# Patient Record
Sex: Female | Born: 1985
Health system: Southern US, Community
[De-identification: ages and names within clinical notes are randomized; demographics above are authoritative.]

## PROBLEM LIST (undated history)

## (undated) DIAGNOSIS — R519 Headache, unspecified: Secondary | ICD-10-CM

## (undated) DIAGNOSIS — J40 Bronchitis, not specified as acute or chronic: Secondary | ICD-10-CM

## (undated) DIAGNOSIS — Z8489 Family history of other specified conditions: Secondary | ICD-10-CM

## (undated) DIAGNOSIS — J189 Pneumonia, unspecified organism: Secondary | ICD-10-CM

## (undated) DIAGNOSIS — H6690 Otitis media, unspecified, unspecified ear: Secondary | ICD-10-CM

## (undated) DIAGNOSIS — I493 Ventricular premature depolarization: Secondary | ICD-10-CM

## (undated) DIAGNOSIS — R51 Headache: Secondary | ICD-10-CM

## (undated) DIAGNOSIS — F419 Anxiety disorder, unspecified: Secondary | ICD-10-CM

## (undated) HISTORY — PX: TONSILLECTOMY: SUR1361

## (undated) HISTORY — PX: OTHER SURGICAL HISTORY: SHX169

---
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
## 2007-09-19 ENCOUNTER — Emergency Department (HOSPITAL_COMMUNITY): Admission: EM | Admit: 2007-09-19 | Discharge: 2007-09-19 | Payer: Self-pay | Admitting: Emergency Medicine

## 2008-05-06 ENCOUNTER — Inpatient Hospital Stay: Payer: Self-pay | Admitting: Unknown Physician Specialty

## 2008-05-09 ENCOUNTER — Emergency Department: Payer: Self-pay | Admitting: Emergency Medicine

## 2011-04-10 LAB — DIFFERENTIAL
Basophils Absolute: 0
Eosinophils Absolute: 0
Eosinophils Relative: 0

## 2011-04-10 LAB — CBC
HCT: 37.8
MCHC: 34.9
MCV: 89.7
Platelets: 263
RDW: 13.8

## 2011-04-10 LAB — URINALYSIS, ROUTINE W REFLEX MICROSCOPIC
Bilirubin Urine: NEGATIVE
Protein, ur: NEGATIVE
Urobilinogen, UA: 0.2

## 2011-04-10 LAB — BASIC METABOLIC PANEL
BUN: 7
Chloride: 103
Glucose, Bld: 83
Potassium: 3.5

## 2012-08-02 ENCOUNTER — Emergency Department: Payer: Self-pay | Admitting: Emergency Medicine

## 2013-02-15 ENCOUNTER — Emergency Department: Payer: Self-pay | Admitting: Emergency Medicine

## 2013-05-02 ENCOUNTER — Emergency Department: Payer: Self-pay | Admitting: Emergency Medicine

## 2013-07-14 ENCOUNTER — Emergency Department: Payer: Self-pay | Admitting: Emergency Medicine

## 2013-07-14 LAB — URINALYSIS, COMPLETE
Blood: NEGATIVE
Glucose,UR: NEGATIVE mg/dL (ref 0–75)
Ketone: NEGATIVE
Nitrite: NEGATIVE
Ph: 5 (ref 4.5–8.0)
Protein: NEGATIVE
RBC,UR: <1 /HPF (ref 0–5)
Squamous Epithelial: 2
WBC UR: 2 /HPF (ref 0–5)

## 2013-07-14 LAB — COMPREHENSIVE METABOLIC PANEL
Bilirubin,Total: 0.2 mg/dL (ref 0.2–1.0)
Chloride: 105 mmol/L (ref 98–107)
Creatinine: 0.52 mg/dL — ABNORMAL LOW (ref 0.60–1.30)
EGFR (African American): >60
Glucose: 89 mg/dL (ref 65–99)
Potassium: 3.7 mmol/L (ref 3.5–5.1)
SGOT(AST): 22 U/L (ref 15–37)
SGPT (ALT): 26 U/L (ref 12–78)
Sodium: 134 mmol/L — ABNORMAL LOW (ref 136–145)
Total Protein: 7.7 g/dL (ref 6.4–8.2)

## 2013-07-14 LAB — CBC WITH DIFFERENTIAL/PLATELET
Eosinophil %: 1.2 %
HCT: 35.5 % (ref 35.0–47.0)
HGB: 12.4 g/dL (ref 12.0–16.0)
Lymphocyte #: 2.1 10*3/uL (ref 1.0–3.6)
MCH: 31 pg (ref 26.0–34.0)
RBC: 3.98 10*6/uL (ref 3.80–5.20)
RDW: 13.1 % (ref 11.5–14.5)

## 2013-07-14 LAB — RAPID INFLUENZA A&B ANTIGENS

## 2013-07-14 LAB — HCG, QUANTITATIVE, PREGNANCY: Beta Hcg, Quant.: 68599 m[IU]/mL — ABNORMAL HIGH

## 2013-08-02 ENCOUNTER — Emergency Department: Payer: Self-pay | Admitting: Emergency Medicine

## 2013-08-02 LAB — CBC
HCT: 33.4 % — ABNORMAL LOW (ref 35.0–47.0)
HGB: 11.9 g/dL — AB (ref 12.0–16.0)
MCH: 31.9 pg (ref 26.0–34.0)
MCHC: 35.6 g/dL (ref 32.0–36.0)
MCV: 90 fL (ref 80–100)
PLATELETS: 239 10*3/uL (ref 150–440)
RBC: 3.73 10*6/uL — ABNORMAL LOW (ref 3.80–5.20)
RDW: 13.2 % (ref 11.5–14.5)
WBC: 11.2 10*3/uL — ABNORMAL HIGH (ref 3.6–11.0)

## 2013-08-02 LAB — BASIC METABOLIC PANEL
Anion Gap: 5 — ABNORMAL LOW (ref 7–16)
BUN: 9 mg/dL (ref 7–18)
CALCIUM: 8.8 mg/dL (ref 8.5–10.1)
CHLORIDE: 105 mmol/L (ref 98–107)
Co2: 25 mmol/L (ref 21–32)
Creatinine: 0.6 mg/dL (ref 0.60–1.30)
EGFR (African American): >60
EGFR (Non-African Amer.): >60
Glucose: 84 mg/dL (ref 65–99)
Osmolality: 268 (ref 275–301)
Potassium: 3.1 mmol/L — ABNORMAL LOW (ref 3.5–5.1)
SODIUM: 135 mmol/L — AB (ref 136–145)

## 2013-12-21 ENCOUNTER — Observation Stay: Payer: Self-pay | Admitting: Obstetrics and Gynecology

## 2014-04-02 ENCOUNTER — Emergency Department: Payer: Self-pay | Admitting: Internal Medicine

## 2014-10-02 ENCOUNTER — Emergency Department: Payer: Self-pay | Admitting: Emergency Medicine

## 2014-10-03 ENCOUNTER — Emergency Department: Payer: Self-pay | Admitting: Physician Assistant

## 2015-01-17 ENCOUNTER — Encounter: Payer: Self-pay | Admitting: Emergency Medicine

## 2015-01-17 ENCOUNTER — Emergency Department
Admission: EM | Admit: 2015-01-17 | Discharge: 2015-01-17 | Disposition: A | Discharge status: Home / Self Care | Payer: BLUE CROSS/BLUE SHIELD | Attending: Emergency Medicine | Admitting: Emergency Medicine

## 2015-01-17 DIAGNOSIS — J020 Streptococcal pharyngitis: Secondary | ICD-10-CM | POA: Diagnosis not present

## 2015-01-17 DIAGNOSIS — J029 Acute pharyngitis, unspecified: Secondary | ICD-10-CM | POA: Diagnosis present

## 2015-01-17 DIAGNOSIS — Z720 Tobacco use: Secondary | ICD-10-CM | POA: Insufficient documentation

## 2015-01-17 HISTORY — DX: Otitis media, unspecified, unspecified ear: H66.90

## 2015-01-17 LAB — POCT RAPID STREP A: Streptococcus, Group A Screen (Direct): NEGATIVE

## 2015-01-17 MED ORDER — LIDOCAINE VISCOUS 2 % MT SOLN
15.0000 mL | Freq: Once | OROMUCOSAL | Status: AC
  Administered 2015-01-17: 15 mL via OROMUCOSAL

## 2015-01-17 MED ORDER — LIDOCAINE VISCOUS 2 % MT SOLN
OROMUCOSAL | Status: AC
  Filled 2015-01-17: qty 15

## 2015-01-17 MED ORDER — DEXAMETHASONE SODIUM PHOSPHATE 10 MG/ML IJ SOLN
INTRAMUSCULAR | Status: AC
  Administered 2015-01-17: 10 mg via INTRAMUSCULAR
  Filled 2015-01-17: qty 1

## 2015-01-17 MED ORDER — DEXAMETHASONE SODIUM PHOSPHATE 10 MG/ML IJ SOLN
10.0000 mg | Freq: Once | INTRAMUSCULAR | Status: AC
  Administered 2015-01-17: 10 mg via INTRAMUSCULAR

## 2015-01-17 MED ORDER — LIDOCAINE VISCOUS 2 % MT SOLN: 10.0000 mL | Freq: Two times a day (BID) | OROMUCOSAL | Status: DC | PRN

## 2015-01-17 NOTE — Discharge Instructions (Signed)
Continue home amoxicillin. Take over-the-counter ibuprofen or Tylenol as needed for pain or fever. Rest. Drink plenty of fluids. Take medication as prescribed.  Follow-up with her primary care physician in 2-3 days. Follow-up with the above as needed. Return to the ER for inability to eat or drink, persistent fever, new or worsening concerns.    Strep Throat Strep throat is an infection of the throat caused by a bacteria named Streptococcus pyogenes. Your health care provider may call the infection streptococcal "tonsillitis" or "pharyngitis" depending on whether there are signs of inflammation in the tonsils or back of the throat. Strep throat is most common in children aged 5-15 years during the cold months of the year, but it can occur in people of any age during any season. This infection is spread from person to person (contagious) through coughing, sneezing, or other close contact. SIGNS AND SYMPTOMS   Fever or chills.  Painful, swollen, red tonsils or throat.  Pain or difficulty when swallowing.  White or yellow spots on the tonsils or throat.  Swollen, tender lymph nodes or "glands" of the neck or under the jaw.  Red rash all over the body (rare). DIAGNOSIS  Many different infections can cause the same symptoms. A test must be done to confirm the diagnosis so the right treatment can be given. A "rapid strep test" can help your health care provider make the diagnosis in a few minutes. If this test is not available, a light swab of the infected area can be used for a throat culture test. If a throat culture test is done, results are usually available in a day or two. TREATMENT  Strep throat is treated with antibiotic medicine. HOME CARE INSTRUCTIONS   Gargle with 1 tsp of salt in 1 cup of warm water, 3-4 times per day or as needed for comfort.  Family members who also have a sore throat or fever should be tested for strep throat and treated with antibiotics if they have the strep  infection.  Make sure everyone in your household washes their hands well.  Do not share food, drinking cups, or personal items that could cause the infection to spread to others.  You may need to eat a soft food diet until your sore throat gets better.  Drink enough water and fluids to keep your urine clear or pale yellow. This will help prevent dehydration.  Get plenty of rest.  Stay home from school, day care, or work until you have been on antibiotics for 24 hours.  Take medicines only as directed by your health care provider.  Take your antibiotic medicine as directed by your health care provider. Finish it even if you start to feel better. SEEK MEDICAL CARE IF:   The glands in your neck continue to enlarge.  You develop a rash, cough, or earache.  You cough up green, yellow-brown, or bloody sputum.  You have pain or discomfort not controlled by medicines.  Your problems seem to be getting worse rather than better.  You have a fever. SEEK IMMEDIATE MEDICAL CARE IF:   You develop any new symptoms such as vomiting, severe headache, stiff or painful neck, chest pain, shortness of breath, or trouble swallowing.  You develop severe throat pain, drooling, or changes in your voice.  You develop swelling of the neck, or the skin on the neck becomes red and tender.  You develop signs of dehydration, such as fatigue, dry mouth, and decreased urination.  You become increasingly sleepy, or you  cannot wake up completely. MAKE SURE YOU:  Understand these instructions.  Will watch your condition.  Will get help right away if you are not doing well or get worse. Document Released: 06/30/2000 Document Revised: 11/17/2013 Document Reviewed: 09/01/2010 [facility] Patient Information 2015 [place], [place]. This information is not intended to replace advice given to you by your health care provider. Make sure you discuss any questions you have with your health care provider.

## 2015-01-17 NOTE — ED Notes (Signed)
Pt reports seen was seen Friday at work and was diagnosed with strep throat. Pt states she is currently Amoxicillin but states she feels no better.

## 2015-01-17 NOTE — ED Provider Notes (Signed)
[facility] Emergency Department Provider Note  ____________________________________________  Time seen: Approximately 12:10 PM  I have reviewed the triage vital signs and the nursing notes.   HISTORY  Chief Complaint Sore Throat   HPI Sarah Woods is a 29 y.o. female presents to the ER for complaints of sore throat. Patient states that she works at [employer], and had her sore throat evaluated Friday (2 days ago). Patient states onset of sore throat was Thursday. Patient states that at [facility] her strep swab was positive for strep throat. Patient states that she was then given a prescription for oral amoxicillin 875 mg tablets twice a day for 10 days. Patient states that she has taken this medication as prescribed, that states that she still has a sore throat.  Patient reports that it hurts to eat or drink, but continues to drink fluids well and eats some soup. Reports had a fever on Friday, none since. States last took ibuprofen last night. States did take amoxicillin dose this morning.  Describes sore throat pain as pain 7 out of 10 scratching and painful to swallow. Denies pain radiation. Denies neck or back pain. Denies fall or injury.   Past Medical History  Diagnosis Date  . Ear infection     There are no active problems to display for this patient.   Past Surgical History  Procedure Laterality Date  . Tubes in ears    . Cesarean section    tonsillectomy  No current outpatient prescriptions on file.  Allergies Review of patient's allergies indicates no known allergies.  No family history on file.  Social History History  Substance Use Topics  . Smoking status: Current Some Day Smoker  . Smokeless tobacco: Never Used  . Alcohol Use: No    Review of Systems Constitutional: No fever/chills Eyes: No visual changes. OEH:OZYYQMGN sore throat. Cardiovascular: Denies chest pain. Respiratory: Denies shortness  of breath. Gastrointestinal: No abdominal pain.  No nausea, no vomiting.  No diarrhea.  No constipation. Genitourinary: Negative for dysuria. Musculoskeletal: Negative for back pain. Skin: Negative for rash. Neurological: Negative for headaches, focal weakness or numbness.  10-point ROS otherwise negative.  ____________________________________________   PHYSICAL EXAM:  VITAL SIGNS: ED Triage Vitals  Enc Vitals Group     BP 01/17/15 1053 101/55 mmHg     Pulse Rate 01/17/15 1053 114     Resp 01/17/15 1053 20     Temp 01/17/15 1053 99.2 F (37.3 C)     Temp Source 01/17/15 1053 Oral     SpO2 01/17/15 1053 100 %     Weight 01/17/15 1053 170 lb (77.111 kg)     Height 01/17/15 1053 5\' 5"  (1.651 m)     Head Cir --      Peak Flow --      Pain Score 01/17/15 1056 10     Pain Loc --      Pain Edu? --      Excl. in [place]? --    Blood pressure 110/66, pulse 96, temperature 98.7 F (37.1 C), temperature source Oral, resp. rate 16, height 5\' 5"  (1.651 m), weight 170 lb (77.111 kg), last menstrual period 01/03/2015, SpO2 100 %.    Constitutional: Alert and oriented. Well appearing and in no acute distress. Eyes: Conjunctivae are normal. PERRL. EOMI. Head: Atraumatic. Nose: No congestion/rhinnorhea. Ears: no erythema, normal TMs.  Mouth/Throat: Mucous membranes are moist.  Pharynx mod erythema. No tonsillar swelling. No uvular shift or deviation.  Minimal bilateral phayngeal exudate. No drooling. Clearing oral secretions.  Neck: No stridor.  No cervical spine tenderness to palpation. Hematological/Lymphatic/Immunilogical: Mild anterior cervical lymphadenopathy. Cardiovascular: Normal rate, regular rhythm. Grossly normal heart sounds.  Good peripheral circulation. Respiratory: Normal respiratory effort.  No retractions. Lungs CTAB. Gastrointestinal: Soft and nontender. No distention. No abdominal bruits. No CVA tenderness. Musculoskeletal: No lower extremity tenderness nor edema.  No  joint effusions. Neurologic:  Normal speech and language. No gross focal neurologic deficits are appreciated. Speech is normal. No gait instability. Skin:  Skin is warm, dry and intact. No rash noted. Psychiatric: Mood and affect are normal. Speech and behavior are normal.  ____________________________________________   LABS (all labs ordered are listed, but only abnormal results are displayed)  Labs Reviewed  CULTURE, GROUP A STREP ([facility] ONLY)  POCT RAPID STREP A   ____________________________________________    INITIAL IMPRESSION / ASSESSMENT AND PLAN / ED COURSE  Pertinent labs & imaging results that were available during my care of the patient were reviewed by me and considered in my medical decision making (see chart for details).  No acute distress. Very well-appearing patient. Presents to the ER for complaints of 3 day history of sore throat. States strep throat swab positive on Friday at [facility] and states started on amoxicillin for same on Friday. States here today as she still has sore throat.  Well-appearing patient. Tolerating by mouth fluids. Moist mucous membranes. 10 mg IM Decadron 1 in ER. Viscous lidocaine in ER x one. Pt reports feeling better in ER post medications. Tolerating po fluids. Discussed continue antibiotics, prn viscous lidocaine.  Discuss strict follow-up and return parameters. Follow up with PCP in 2 days. Patient agreed to plan. ____________________________________________   FINAL CLINICAL IMPRESSION(S) / ED DIAGNOSES  Final diagnoses:  Strep throat      Marylene Land, NP 01/17/15 [place], NP 01/17/15 1635  Lavonia Drafts, MD 01/21/15 [phone]

## 2015-01-19 LAB — CULTURE, GROUP A STREP (THRC)

## 2015-05-27 ENCOUNTER — Emergency Department
Admission: EM | Admit: 2015-05-27 | Discharge: 2015-05-27 | Disposition: A | Discharge status: Home / Self Care | Payer: BLUE CROSS/BLUE SHIELD | Attending: Emergency Medicine | Admitting: Emergency Medicine

## 2015-05-27 DIAGNOSIS — K611 Rectal abscess: Secondary | ICD-10-CM | POA: Diagnosis not present

## 2015-05-27 DIAGNOSIS — F17200 Nicotine dependence, unspecified, uncomplicated: Secondary | ICD-10-CM | POA: Insufficient documentation

## 2015-05-27 DIAGNOSIS — K644 Residual hemorrhoidal skin tags: Secondary | ICD-10-CM | POA: Diagnosis not present

## 2015-05-27 DIAGNOSIS — K6289 Other specified diseases of anus and rectum: Secondary | ICD-10-CM | POA: Diagnosis present

## 2015-05-27 LAB — CBC WITH DIFFERENTIAL/PLATELET
Basophils Absolute: 0.1 10*3/uL (ref 0–0.1)
Basophils Relative: 1 %
Eosinophils Absolute: 0.2 10*3/uL (ref 0–0.7)
Eosinophils Relative: 2 %
HCT: 38.2 % (ref 35.0–47.0)
HEMOGLOBIN: 13 g/dL (ref 12.0–16.0)
LYMPHS ABS: 2.2 10*3/uL (ref 1.0–3.6)
LYMPHS PCT: 21 %
MCH: 30.7 pg (ref 26.0–34.0)
MCHC: 33.9 g/dL (ref 32.0–36.0)
MCV: 90.7 fL (ref 80.0–100.0)
Monocytes Absolute: 0.7 10*3/uL (ref 0.2–0.9)
Monocytes Relative: 7 %
NEUTROS PCT: 69 %
Neutro Abs: 7.5 10*3/uL — ABNORMAL HIGH (ref 1.4–6.5)
Platelets: 261 10*3/uL (ref 150–440)
RBC: 4.22 MIL/uL (ref 3.80–5.20)
RDW: 13.8 % (ref 11.5–14.5)
WBC: 10.7 10*3/uL (ref 3.6–11.0)

## 2015-05-27 LAB — COMPREHENSIVE METABOLIC PANEL
ALK PHOS: 60 U/L (ref 38–126)
ALT: 17 U/L (ref 14–54)
AST: 18 U/L (ref 15–41)
Albumin: 4.1 g/dL (ref 3.5–5.0)
Anion gap: 8 (ref 5–15)
BILIRUBIN TOTAL: 0.6 mg/dL (ref 0.3–1.2)
BUN: 14 mg/dL (ref 6–20)
CALCIUM: 9.4 mg/dL (ref 8.9–10.3)
CO2: 27 mmol/L (ref 22–32)
CREATININE: 0.75 mg/dL (ref 0.44–1.00)
Chloride: 104 mmol/L (ref 101–111)
Glucose, Bld: 99 mg/dL (ref 65–99)
Potassium: 3.3 mmol/L — ABNORMAL LOW (ref 3.5–5.1)
Sodium: 139 mmol/L (ref 135–145)
Total Protein: 8.2 g/dL — ABNORMAL HIGH (ref 6.5–8.1)

## 2015-05-27 MED ORDER — DEXTROSE 5 % IV SOLN
1.0000 g | Freq: Once | INTRAVENOUS | Status: AC
  Administered 2015-05-27: 1 g via INTRAVENOUS
  Filled 2015-05-27: qty 10

## 2015-05-27 MED ORDER — MORPHINE SULFATE (PF) 4 MG/ML IV SOLN
4.0000 mg | Freq: Once | INTRAVENOUS | Status: AC
  Administered 2015-05-27: 4 mg via INTRAVENOUS
  Filled 2015-05-27: qty 1

## 2015-05-27 MED ORDER — HYDROMORPHONE HCL 1 MG/ML IJ SOLN
1.0000 mg | Freq: Once | INTRAMUSCULAR | Status: AC
  Administered 2015-05-27: 1 mg via INTRAVENOUS
  Filled 2015-05-27: qty 1

## 2015-05-27 MED ORDER — OXYCODONE-ACETAMINOPHEN 5-325 MG PO TABS: 1.0000 | ORAL_TABLET | ORAL | Status: DC | PRN

## 2015-05-27 MED ORDER — SULFAMETHOXAZOLE-TRIMETHOPRIM 800-160 MG PO TABS: 2.0000 | ORAL_TABLET | Freq: Two times a day (BID) | ORAL | Status: DC

## 2015-05-27 MED ORDER — ONDANSETRON HCL 4 MG/2ML IJ SOLN
4.0000 mg | Freq: Once | INTRAMUSCULAR | Status: AC
  Administered 2015-05-27: 4 mg via INTRAVENOUS
  Filled 2015-05-27: qty 2

## 2015-05-27 NOTE — ED Provider Notes (Signed)
[facility] Emergency Department Provider Note ____________________________________________  Time seen: Approximately 9:50 PM  I have reviewed the triage vital signs and the nursing notes.   HISTORY  Chief Complaint Rectal Pain  HPI Sarah Woods is a 29 y.o. female who presents to the emergency department for evaluation of rectal pain. She has had hemorrhoids for quite some time that have been painful, but this pain is different and severe. She states she has been evaluated by PCP and advised to follow up with surgery, but has not scheduled an appointment. She states the pain is excruciating with movement or sitting. No relief with sitz baths or witch hazel wipes.   Past Medical History  Diagnosis Date  . Ear infection     There are no active problems to display for this patient.   Past Surgical History  Procedure Laterality Date  . Tubes in ears    . Cesarean section      Current Outpatient Rx  Name  Route  Sig  Dispense  Refill  . lidocaine (XYLOCAINE) 2 % solution   Mouth/Throat   Use as directed 10 mLs in the mouth or throat 2 (two) times daily as needed (sore throat,). Gargle then spit, use as needed for sore throat.   60 mL   0   . oxyCODONE-acetaminophen (PERCOCET/ROXICET) 5-325 MG tablet   Oral   Take 1-2 tablets by mouth every 4 (four) hours as needed for severe pain.   20 tablet   0   . sulfamethoxazole-trimethoprim (BACTRIM DS,SEPTRA DS) 800-160 MG tablet   Oral   Take 2 tablets by mouth 2 (two) times daily.   40 tablet   0     Allergies Review of patient's allergies indicates no known allergies.  No family history on file.  Social History Social History  Substance Use Topics  . Smoking status: Current Some Day Smoker  . Smokeless tobacco: Never Used  . Alcohol Use: No    Review of Systems Constitutional: No fever/chills Eyes: No visual changes. ENT: No sore throat. Cardiovascular: Denies chest  pain. Respiratory: Denies shortness of breath. Gastrointestinal: No abdominal pain.  No nausea, no vomiting.  No diarrhea.  No constipation. Positive for hemorrhoids and rectal pain. Genitourinary: Negative for dysuria. Musculoskeletal: Negative for back pain. Skin: Negative for rash. Neurological: Negative for headaches, focal weakness or numbness.  10-point ROS otherwise negative.  ____________________________________________   PHYSICAL EXAM:  VITAL SIGNS: ED Triage Vitals  Enc Vitals Group     BP 05/27/15 2117 120/72 mmHg     Pulse Rate 05/27/15 2117 99     Resp 05/27/15 2117 18     Temp 05/27/15 2117 98.2 F (36.8 C)     Temp Source 05/27/15 2117 Oral     SpO2 05/27/15 2117 100 %     Weight 05/27/15 2117 162 lb (73.483 kg)     Height 05/27/15 2117 5\' 5"  (1.651 m)     Head Cir --      Peak Flow --      Pain Score 05/27/15 2119 9     Pain Loc --      Pain Edu? --      Excl. in [place]? --     Constitutional: Alert and oriented. Well appearing and in no acute distress. Eyes: Conjunctivae are normal. PERRL. EOMI. Head: Atraumatic. Nose: No congestion/rhinnorhea. Mouth/Throat: Mucous membranes are moist.  Oropharynx non-erythematous. Neck: No stridor.  Cardiovascular: Normal rate, regular rhythm. Grossly normal heart sounds.  Good peripheral circulation. Respiratory: Normal respiratory effort.  No retractions. Lungs CTAB. Gastrointestinal: Soft and nontender. No distention. No abdominal bruits. No CVA tenderness. Rectal: External hemorrhoids noted that do not appear inflamed, thrombosed or infected. Palpable, erythematous approximately 2cm round mass noted at the 5 o'clock position at the edge of the anus. Mass is not raised above the surface of the skin, but does feel fluctuant. There is no induration. Musculoskeletal: No lower extremity tenderness nor edema.  No joint effusions. Neurologic:  Normal speech and language. No gross focal neurologic deficits are appreciated. No  gait instability. Skin:  Skin is warm, dry and intact. No rash noted. Psychiatric: Mood and affect are normal. Speech and behavior are normal.  ____________________________________________   LABS (all labs ordered are listed, but only abnormal results are displayed)  Labs Reviewed  CBC WITH DIFFERENTIAL/PLATELET - Abnormal; Notable for the following:    Neutro Abs 7.5 (*)    All other components within normal limits  COMPREHENSIVE METABOLIC PANEL - Abnormal; Notable for the following:    Potassium 3.3 (*)    Total Protein 8.2 (*)    All other components within normal limits   ____________________________________________  EKG  ____________________________________________  RADIOLOGY  Not indicated ____________________________________________   PROCEDURES  Procedure(s) performed: None  Critical Care performed: No  ____________________________________________   INITIAL IMPRESSION / ASSESSMENT AND PLAN / ED COURSE  Pertinent labs & imaging results that were available during my care of the patient were reviewed by me and considered in my medical decision making (see chart for details).  Rocephin 1g, Morphine 4mg , Ondansetron 4mg , and Dilaudid 1mg  given in the emergency department.  Case was reviewed with Dr. Adonis Huguenin who will see her in his clinic on Monday. She is to call to schedule an appointment tomorrow.   Strict return precautions were given.  She is to take sitz baths often throughout the day and make sure to take the antibiotic as prescribed.  Patient and mother verbalize understanding of instructions.  ____________________________________________   FINAL CLINICAL IMPRESSION(S) / ED DIAGNOSES  Final diagnoses:  Abscess, perirectal      Victorino Dike, FNP 05/27/15 [id number]  Nance Pear, MD 05/31/15 [phone]

## 2016-06-16 DIAGNOSIS — J40 Bronchitis, not specified as acute or chronic: Secondary | ICD-10-CM

## 2016-06-16 HISTORY — DX: Bronchitis, not specified as acute or chronic: J40

## 2016-06-19 DIAGNOSIS — H60339 Swimmer's ear, unspecified ear: Secondary | ICD-10-CM | POA: Diagnosis not present

## 2016-08-13 DIAGNOSIS — R0600 Dyspnea, unspecified: Secondary | ICD-10-CM | POA: Diagnosis not present

## 2016-11-13 DIAGNOSIS — K602 Anal fissure, unspecified: Secondary | ICD-10-CM | POA: Diagnosis not present

## 2016-11-13 DIAGNOSIS — K648 Other hemorrhoids: Secondary | ICD-10-CM | POA: Diagnosis not present

## 2016-11-13 DIAGNOSIS — K644 Residual hemorrhoidal skin tags: Secondary | ICD-10-CM | POA: Diagnosis not present

## 2016-11-15 ENCOUNTER — Encounter
Admission: RE | Admit: 2016-11-15 | Discharge: 2016-11-15 | Disposition: A | Discharge status: Home / Self Care | Payer: 59 | Source: Ambulatory Visit | Attending: Surgery | Admitting: Surgery

## 2016-11-15 HISTORY — DX: Family history of other specified conditions: Z84.89

## 2016-11-15 HISTORY — DX: Pneumonia, unspecified organism: J18.9

## 2016-11-15 HISTORY — DX: Anxiety disorder, unspecified: F41.9

## 2016-11-15 HISTORY — DX: Headache, unspecified: R51.9

## 2016-11-15 HISTORY — DX: Bronchitis, not specified as acute or chronic: J40

## 2016-11-15 HISTORY — DX: Headache: R51

## 2016-11-15 NOTE — Patient Instructions (Signed)
  Your procedure is scheduled on: 11-23-16 Report to Same Day Surgery 2nd floor medical mall [facility] Entrance-take elevator on left to 2nd floor.  Check in with surgery information desk.) To find out your arrival time please call [phone] between 1PM - 3PM on 11-22-16  Remember: Instructions that are not followed completely may result in serious medical risk, up to and including death, or upon the discretion of your surgeon and anesthesiologist your surgery may need to be rescheduled.    _x___ 1. Do not eat food or drink liquids after midnight. No gum chewing or                              hard candies.     __x__ 2. No Alcohol for 24 hours before or after surgery.   __x__3. No Smoking for 24 prior to surgery.   ____  4. Bring all medications with you on the day of surgery if instructed.    __x__ 5. Notify your doctor if there is any change in your medical condition     (cold, fever, infections).     Do not wear jewelry, make-up, hairpins, clips or nail polish.  Do not wear lotions, powders, or perfumes. You may wear deodorant.  Do not shave 48 hours prior to surgery. Men may shave face and neck.  Do not bring valuables to the hospital.    [facility] is not responsible for any belongings or valuables.               Contacts, dentures or bridgework may not be worn into surgery.  Leave your suitcase in the car. After surgery it may be brought to your room.  For patients admitted to the hospital, discharge time is determined by your                       treatment team.   Patients discharged the day of surgery will not be allowed to drive home.  You will need someone to drive you home and stay with you the night of your procedure.    Please read over the following fact sheets that you were given:   [facility] Preparing for Surgery and or MRSA Information   ____ Take anti-hypertensive (unless it includes a diuretic), cardiac, seizure, asthma,     anti-reflux and psychiatric  medicines. These include:  1. NONE  2.  3.  4.  5.  6.  ____Fleets enema or Magnesium Citrate as directed.   ____ Use CHG Soap or sage wipes as directed on instruction sheet   ____ Use inhalers on the day of surgery and bring to hospital day of surgery  ____ Stop Metformin and Janumet 2 days prior to surgery.    ____ Take 1/2 of usual insulin dose the night before surgery and none on the morning     surgery.   ____ Follow recommendations from Cardiologist, Pulmonologist or PCP regarding stopping Aspirin, Coumadin, Pllavix ,Eliquis, Effient, or Pradaxa, and Pletal.  X____Stop Anti-inflammatories such as Advil, Aleve, IBUPROFEN , Motrin, Naproxen, Naprosyn, Goodies powders or aspirin products. OK to take Tylenol   -------Stop supplements until after surgery.     ____ Bring C-Pap to the hospital.

## 2016-11-22 ENCOUNTER — Encounter: Payer: Self-pay | Admitting: *Deleted

## 2016-11-23 ENCOUNTER — Ambulatory Visit: Payer: 59 | Admitting: Anesthesiology

## 2016-11-23 ENCOUNTER — Encounter
Admission: RE | Disposition: A | Discharge status: Home / Self Care | Payer: Self-pay | Source: Ambulatory Visit | Attending: Surgery

## 2016-11-23 ENCOUNTER — Encounter: Payer: Self-pay | Admitting: *Deleted

## 2016-11-23 ENCOUNTER — Ambulatory Visit
Admission: RE | Admit: 2016-11-23 | Discharge: 2016-11-23 | Disposition: A | Discharge status: Home / Self Care | Payer: 59 | Source: Ambulatory Visit | Attending: Surgery | Admitting: Surgery

## 2016-11-23 DIAGNOSIS — Z79899 Other long term (current) drug therapy: Secondary | ICD-10-CM | POA: Diagnosis not present

## 2016-11-23 DIAGNOSIS — F17210 Nicotine dependence, cigarettes, uncomplicated: Secondary | ICD-10-CM | POA: Insufficient documentation

## 2016-11-23 DIAGNOSIS — K644 Residual hemorrhoidal skin tags: Secondary | ICD-10-CM | POA: Diagnosis not present

## 2016-11-23 DIAGNOSIS — K602 Anal fissure, unspecified: Secondary | ICD-10-CM | POA: Diagnosis not present

## 2016-11-23 DIAGNOSIS — K648 Other hemorrhoids: Secondary | ICD-10-CM | POA: Insufficient documentation

## 2016-11-23 DIAGNOSIS — F419 Anxiety disorder, unspecified: Secondary | ICD-10-CM | POA: Diagnosis not present

## 2016-11-23 HISTORY — PX: HEMORRHOID SURGERY: SHX153

## 2016-11-23 HISTORY — PX: SPHINCTEROTOMY: SHX5279

## 2016-11-23 HISTORY — DX: Ventricular premature depolarization: I49.3

## 2016-11-23 LAB — POCT PREGNANCY, URINE: PREG TEST UR: NEGATIVE

## 2016-11-23 SURGERY — HEMORRHOIDECTOMY
Anesthesia: General

## 2016-11-23 MED ORDER — FENTANYL CITRATE (PF) 100 MCG/2ML IJ SOLN
INTRAMUSCULAR | Status: AC
  Filled 2016-11-23: qty 2

## 2016-11-23 MED ORDER — LACTATED RINGERS IV SOLN
INTRAVENOUS | Status: DC
  Administered 2016-11-23 (×2): via INTRAVENOUS

## 2016-11-23 MED ORDER — LIDOCAINE HCL (PF) 2 % IJ SOLN
INTRAMUSCULAR | Status: AC
  Filled 2016-11-23: qty 2

## 2016-11-23 MED ORDER — BUPIVACAINE LIPOSOME 1.3 % IJ SUSP
INTRAMUSCULAR | Status: DC | PRN
  Administered 2016-11-23: 20 mL

## 2016-11-23 MED ORDER — PROPOFOL 10 MG/ML IV BOLUS
INTRAVENOUS | Status: DC | PRN
  Administered 2016-11-23: 200 mg via INTRAVENOUS

## 2016-11-23 MED ORDER — PROPOFOL 10 MG/ML IV BOLUS
INTRAVENOUS | Status: AC
Start: 2016-11-23 — End: 2016-11-23
  Filled 2016-11-23: qty 20

## 2016-11-23 MED ORDER — BUPIVACAINE LIPOSOME 1.3 % IJ SUSP
INTRAMUSCULAR | Status: AC
  Filled 2016-11-23: qty 20

## 2016-11-23 MED ORDER — FAMOTIDINE 20 MG PO TABS
ORAL_TABLET | ORAL | Status: AC
  Filled 2016-11-23: qty 1

## 2016-11-23 MED ORDER — MIDAZOLAM HCL 2 MG/2ML IJ SOLN
INTRAMUSCULAR | Status: DC | PRN
  Administered 2016-11-23: 2 mg via INTRAVENOUS

## 2016-11-23 MED ORDER — MIDAZOLAM HCL 2 MG/2ML IJ SOLN
INTRAMUSCULAR | Status: AC
  Filled 2016-11-23: qty 2

## 2016-11-23 MED ORDER — ONDANSETRON HCL 4 MG/2ML IJ SOLN: 4.0000 mg | Freq: Once | INTRAMUSCULAR | Status: DC | PRN

## 2016-11-23 MED ORDER — OXYCODONE-ACETAMINOPHEN 5-325 MG PO TABS
ORAL_TABLET | ORAL | Status: AC
  Filled 2016-11-23: qty 1

## 2016-11-23 MED ORDER — OXYCODONE-ACETAMINOPHEN 5-325 MG PO TABS: 1.0000 | ORAL_TABLET | ORAL | 0 refills | Status: DC | PRN

## 2016-11-23 MED ORDER — FENTANYL CITRATE (PF) 100 MCG/2ML IJ SOLN
25.0000 ug | INTRAMUSCULAR | Status: AC | PRN
  Administered 2016-11-23 (×6): 25 ug via INTRAVENOUS

## 2016-11-23 MED ORDER — KETOROLAC TROMETHAMINE 30 MG/ML IJ SOLN
INTRAMUSCULAR | Status: DC | PRN
  Administered 2016-11-23: 30 mg via INTRAVENOUS

## 2016-11-23 MED ORDER — BUPIVACAINE-EPINEPHRINE (PF) 0.5% -1:200000 IJ SOLN
INTRAMUSCULAR | Status: DC | PRN
  Administered 2016-11-23: 10 mL via PERINEURAL

## 2016-11-23 MED ORDER — FENTANYL CITRATE (PF) 100 MCG/2ML IJ SOLN
INTRAMUSCULAR | Status: DC | PRN
  Administered 2016-11-23 (×2): 50 ug via INTRAVENOUS

## 2016-11-23 MED ORDER — FENTANYL CITRATE (PF) 100 MCG/2ML IJ SOLN
INTRAMUSCULAR | Status: AC
  Administered 2016-11-23: 25 ug via INTRAVENOUS
  Filled 2016-11-23: qty 2

## 2016-11-23 MED ORDER — FAMOTIDINE 20 MG PO TABS
20.0000 mg | ORAL_TABLET | Freq: Once | ORAL | Status: AC
  Administered 2016-11-23: 20 mg via ORAL

## 2016-11-23 MED ORDER — BUPIVACAINE-EPINEPHRINE (PF) 0.5% -1:200000 IJ SOLN
INTRAMUSCULAR | Status: AC
  Filled 2016-11-23: qty 30

## 2016-11-23 MED ORDER — LIDOCAINE HCL (CARDIAC) 20 MG/ML IV SOLN
INTRAVENOUS | Status: DC | PRN
  Administered 2016-11-23: 100 mg via INTRAVENOUS

## 2016-11-23 MED ORDER — DEXAMETHASONE SODIUM PHOSPHATE 10 MG/ML IJ SOLN
INTRAMUSCULAR | Status: DC | PRN
  Administered 2016-11-23: 10 mg via INTRAVENOUS

## 2016-11-23 MED ORDER — OXYCODONE-ACETAMINOPHEN 5-325 MG PO TABS
1.0000 | ORAL_TABLET | ORAL | Status: DC | PRN
  Administered 2016-11-23: 1 via ORAL

## 2016-11-23 MED ORDER — ONDANSETRON HCL 4 MG/2ML IJ SOLN
INTRAMUSCULAR | Status: DC | PRN
  Administered 2016-11-23: 4 mg via INTRAVENOUS

## 2016-11-23 SURGICAL SUPPLY — 44 items
BLADE SURG 15 STRL LF DISP TIS (BLADE) ×1 IMPLANT
BLADE SURG 15 STRL SS (BLADE) ×3
CANISTER SUCT 1200ML W/VALVE (MISCELLANEOUS) ×3 IMPLANT
CNTNR SPEC 2.5X3XGRAD LEK (MISCELLANEOUS) ×1
CONT SPEC 4OZ STER OR WHT (MISCELLANEOUS) ×2
CONT SPEC 4OZ STRL OR WHT (MISCELLANEOUS) ×1
CONTAINER SPEC 2.5X3XGRAD LEK (MISCELLANEOUS) IMPLANT
DRAPE LAPAROTOMY 100X77 ABD (DRAPES) ×3 IMPLANT
DRAPE LEGGINS SURG 28X43 STRL (DRAPES) ×3 IMPLANT
DRAPE UNDER BUTTOCK W/FLU (DRAPES) ×3 IMPLANT
ELECT REM PT RETURN 9FT ADLT (ELECTROSURGICAL) ×3
ELECTRODE REM PT RTRN 9FT ADLT (ELECTROSURGICAL) ×1 IMPLANT
GAUZE SPONGE 4X4 12PLY STRL (GAUZE/BANDAGES/DRESSINGS) ×3 IMPLANT
GLOVE BIO SURGEON STRL SZ7.5 (GLOVE) ×3 IMPLANT
GLOVE BIOGEL PI IND STRL 7.5 (GLOVE) IMPLANT
GLOVE BIOGEL PI INDICATOR 7.5 (GLOVE) ×8
GOWN STRL REUS W/ TWL LRG LVL3 (GOWN DISPOSABLE) ×2 IMPLANT
GOWN STRL REUS W/TWL LRG LVL3 (GOWN DISPOSABLE) ×9
KIT RM TURNOVER CYSTO AR (KITS) ×3 IMPLANT
LABEL OR SOLS (LABEL) ×3 IMPLANT
NDL HYPO 25X1 1.5 SAFETY (NEEDLE) ×1 IMPLANT
NEEDLE HYPO 25X1 1.5 SAFETY (NEEDLE) ×3 IMPLANT
NS IRRIG 500ML POUR BTL (IV SOLUTION) ×3 IMPLANT
PACK BASIN MINOR ARMC (MISCELLANEOUS) ×3 IMPLANT
PAD ABD DERMACEA PRESS 5X9 (GAUZE/BANDAGES/DRESSINGS) ×3 IMPLANT
PAD PREP 24X41 OB/GYN DISP (PERSONAL CARE ITEMS) ×3 IMPLANT
SHEARS HARMONIC 9CM CVD (BLADE) ×3 IMPLANT
SOL PREP PVP 2OZ (MISCELLANEOUS) ×6
SOLUTION PREP PVP 2OZ (MISCELLANEOUS) ×1 IMPLANT
SPONGE XRAY 4X4 16PLY STRL (MISCELLANEOUS) ×2 IMPLANT
STAPLER PROXIMATE HCS (STAPLE) IMPLANT
STRAP SAFETY BODY (MISCELLANEOUS) ×3 IMPLANT
SURGILUBE 2OZ TUBE FLIPTOP (MISCELLANEOUS) ×3 IMPLANT
SUT CHROMIC 2 0 SH (SUTURE) ×2 IMPLANT
SUT CHROMIC 3 0 SH 27 (SUTURE) ×7 IMPLANT
SUT CHROMIC 4 0 SH 27 (SUTURE) ×4 IMPLANT
SUT CHROMIC 5 0 RB 1 27 (SUTURE) ×2 IMPLANT
SUT ETHILON 3-0 FS-10 30 BLK (SUTURE)
SUT PROLENE 3 0 PS 2 (SUTURE) IMPLANT
SUT VIC AB 3-0 SH 27 (SUTURE)
SUT VIC AB 3-0 SH 27X BRD (SUTURE) ×1 IMPLANT
SUTURE EHLN 3-0 FS-10 30 BLK (SUTURE) ×1 IMPLANT
SYR 10ML LL (SYRINGE) ×2 IMPLANT
SYRINGE 10CC LL (SYRINGE) ×3 IMPLANT

## 2016-11-23 NOTE — H&P (Signed)
  She comes in today prepared for internal and external hemorrhoidectomy and lateral internal anal sphincterotomy.  She reports no change in overall condition since the office visit.  She has been taking the stool softener each day. I discussed the plan for surgery

## 2016-11-23 NOTE — Anesthesia Procedure Notes (Signed)
Procedure Name: LMA Insertion Date/Time: 11/23/2016 9:59 AM Performed by: [facility] Pre-anesthesia Checklist: Patient identified, Emergency Drugs available, Suction available and Patient being monitored Patient Re-evaluated:Patient Re-evaluated prior to inductionOxygen Delivery Method: Circle system utilized Preoxygenation: Pre-oxygenation with 100% oxygen Intubation Type: IV induction Ventilation: Mask ventilation without difficulty LMA: LMA inserted LMA Size: 3.5 Number of attempts: 1 Placement Confirmation: positive ETCO2 Dental Injury: Teeth and Oropharynx as per pre-operative assessment

## 2016-11-23 NOTE — Discharge Instructions (Addendum)
Take Tylenol or Percocet if needed for pain.  Should not drive or do anything dangerous when taking Percocet.  Take stool softener 2 times per day. May later decrease to 1 time per day as needed.  Remove dressing later today or tomorrow. May then shower and/or sitting in warm water.  Tuck gauze or pad in underwear and change as needed for drainage.   AMBULATORY SURGERY  DISCHARGE INSTRUCTIONS   1) The drugs that you were given will stay in your system until tomorrow so for the next 24 hours you should not:  A) Drive an automobile B) Make any legal decisions C) Drink any alcoholic beverage   2) You may resume regular meals tomorrow.  Today it is better to start with liquids and gradually work up to solid foods.  You may eat anything you prefer, but it is better to start with liquids, then soup and crackers, and gradually work up to solid foods.   3) Please notify your doctor immediately if you have any unusual bleeding, trouble breathing, redness and pain at the surgery site, drainage, fever, or pain not relieved by medication.    4) Additional Instructions:  Please contact your physician with any problems or Same Day Surgery at [phone], Monday through Friday 6 am to 4 pm, or [place] at [facility] number at [phone].

## 2016-11-23 NOTE — Anesthesia Post-op Follow-up Note (Cosign Needed)
Anesthesia QCDR form completed.        

## 2016-11-23 NOTE — Transfer of Care (Signed)
Immediate Anesthesia Transfer of Care Note  Patient: Sarah Woods  Procedure(s) Performed: Procedure(s): HEMORRHOIDECTOMY (N/A) SPHINCTEROTOMY (N/A)  Patient Location: PACU  Anesthesia Type:General  Level of Consciousness: drowsy  Airway & Oxygen Therapy: Patient Spontanous Breathing and Patient connected to nasal cannula oxygen  Post-op Assessment: Report given to RN and Post -op Vital signs reviewed and stable  Post vital signs: Reviewed and stable  Last Vitals:  Vitals:   11/23/16 0816  BP: 120/65  Pulse: 84  Resp: 12  Temp: 36.4 C    Last Pain:  Vitals:   11/23/16 0816  TempSrc: Oral         Complications: No apparent anesthesia complications

## 2016-11-23 NOTE — Anesthesia Preprocedure Evaluation (Signed)
Anesthesia Evaluation  Patient identified by MRN, date of birth, ID band Patient awake    Reviewed: Allergy & Precautions, NPO status , Patient's Chart, lab work & pertinent test results, reviewed documented beta blocker date and time   History of Anesthesia Complications (+) Family history of anesthesia reaction  Airway Mallampati: II  TM Distance: >3 FB     Dental  (+) Chipped   Pulmonary pneumonia, Current Smoker,           Cardiovascular      Neuro/Psych  Headaches, Anxiety    GI/Hepatic   Endo/Other    Renal/GU      Musculoskeletal   Abdominal   Peds  Hematology   Anesthesia Other Findings Hx of frequent PVC's.  Reproductive/Obstetrics                             Anesthesia Physical Anesthesia Plan  ASA: III  Anesthesia Plan: General   Post-op Pain Management:    Induction: Intravenous  Airway Management Planned: LMA  Additional Equipment:   Intra-op Plan:   Post-operative Plan:   Informed Consent: I have reviewed the patients History and Physical, chart, labs and discussed the procedure including the risks, benefits and alternatives for the proposed anesthesia with the patient or authorized representative who has indicated his/her understanding and acceptance.     Plan Discussed with: CRNA  Anesthesia Plan Comments:         Anesthesia Quick Evaluation

## 2016-11-23 NOTE — Op Note (Signed)
OPERATIVE REPORT  PREOPERATIVE  DIAGNOSIS: . Internal and external hemorrhoids, posterior anal fissure  POSTOPERATIVE DIAGNOSIS: . Internal and external hemorrhoids, posterior anal fissure  PROCEDURE: . Internal and external hemorrhoidectomy lateral internal anal sphincterotomy  ANESTHESIA:  General  SURGEON: Rochel Brome  MD   INDICATIONS: . She has a history of excruciating anal pain with a bowel movement. She also has history of hemorrhoids with bleeding. She had physical findings of a posterior fissure and internal and external hemorrhoids. Surgery was recommended for definitive treatment.  With the patient on the operating table in the supine position she was placed under general anesthesia. The legs were elevated into the lithotomy position. The anal area was prepared with Betadine solution and draped with sterile towels and sheets.  The anoderm was retracted to expose an posterior anal fissure there also external hemorrhoids identified adjacent to the fissure. There was a large external hemorrhoid found at the 1:00 position. The anoderm was infiltrated with half percent Sensorcaine with epinephrine. The anal canal was dilated large enough to admit 4 fingers. The bivalve anal retractor was introduced and further demonstrated the posterior anal fissure. There were large internal hemorrhoids at the 1:00 position in addition to the external hemorrhoid at the 1:00 position. There were external hemorrhoids noted surrounding the fissure.  A high ligation of the internal hemorrhoid at the 1:00 position was done with a 3-0 chromic suture ligature. A V-shaped incision was made externally with a scalpel and then carried out the dissection with electrocautery dissecting the external hemorrhoid away from surrounding subcutaneous tissues and also dissected over the internal anal sphincter and the dissection was carried up to the previously placed suture ligature. The hemorrhoid was ligated with the same  suture ligature and then amputated. The wound was inspected and several small bleeding points were cauterized. The wound was closed with a running locked tied 3-0 chromic suture leaving a small opening externally for drainage.  2 external hemorrhoids were excised on each side of the fissure and several small bleeding points cauterized. These wounds were loosely approximated with interrupted 4-0 chromic simple sutures.  An incision was made at the 9:00 position 10 mm in length. Hemostats were used to dissect and expose the internal anal sphincter which was recognized by its white color. 90% of the sphincter was divided with electrocautery. Hemostasis was intact. This wound was closed with a single 4-0 chromic simple suture.  There was moderate bleeding during the course the procedure and minimal degree of oozing at the end of the procedure. The anoderm was again prepared with Betadine solution and then infiltrated 20 cc of Exparel into the subcutaneous tissues and tissues surrounding the sphincter. Dressings were applied with paper tape  The patient appeared to tolerate the procedure satisfactorily and was then prepared for transfer to the recovery room  Rochel Brome M.D..

## 2016-11-23 NOTE — Anesthesia Postprocedure Evaluation (Signed)
Anesthesia Post Note  Patient: Sarah Woods  Procedure(s) Performed: Procedure(s) (LRB): HEMORRHOIDECTOMY (N/A) SPHINCTEROTOMY (N/A)  Patient location during evaluation: PACU Anesthesia Type: General Level of consciousness: awake and alert Pain management: pain level controlled Vital Signs Assessment: post-procedure vital signs reviewed and stable Respiratory status: spontaneous breathing, nonlabored ventilation, respiratory function stable and patient connected to nasal cannula oxygen Cardiovascular status: blood pressure returned to baseline and stable Postop Assessment: no signs of nausea or vomiting Anesthetic complications: no     Last Vitals:  Vitals:   11/23/16 1238 11/23/16 1324  BP: 118/78 110/66  Pulse: 87 93  Resp: 16 16  Temp: 36.7 C     Last Pain:  Vitals:   11/23/16 1324  TempSrc:   PainSc: [place]

## 2016-11-23 NOTE — OR Nursing (Signed)
9 Dr. Tamala Julian visited and [facility] dc home

## 2016-11-24 ENCOUNTER — Encounter: Payer: Self-pay | Admitting: Surgery

## 2016-11-24 LAB — SURGICAL PATHOLOGY

## 2016-12-13 DIAGNOSIS — Z0000 Encounter for general adult medical examination without abnormal findings: Secondary | ICD-10-CM | POA: Diagnosis not present

## 2016-12-13 DIAGNOSIS — Z118 Encounter for screening for other infectious and parasitic diseases: Secondary | ICD-10-CM | POA: Diagnosis not present

## 2016-12-13 DIAGNOSIS — Z23 Encounter for immunization: Secondary | ICD-10-CM | POA: Diagnosis not present

## 2016-12-13 DIAGNOSIS — Z124 Encounter for screening for malignant neoplasm of cervix: Secondary | ICD-10-CM | POA: Diagnosis not present

## 2016-12-13 DIAGNOSIS — Z13220 Encounter for screening for lipoid disorders: Secondary | ICD-10-CM | POA: Diagnosis not present

## 2017-03-20 ENCOUNTER — Ambulatory Visit
Admission: RE | Admit: 2017-03-20 | Discharge: 2017-03-20 | Disposition: A | Discharge status: Home / Self Care | Payer: 59 | Source: Ambulatory Visit | Attending: Internal Medicine | Admitting: Internal Medicine

## 2017-03-20 ENCOUNTER — Other Ambulatory Visit: Payer: Self-pay | Admitting: Internal Medicine

## 2017-03-20 DIAGNOSIS — R079 Chest pain, unspecified: Secondary | ICD-10-CM | POA: Diagnosis not present

## 2017-03-20 DIAGNOSIS — R0602 Shortness of breath: Secondary | ICD-10-CM

## 2017-04-02 ENCOUNTER — Emergency Department: Payer: 59

## 2017-04-02 ENCOUNTER — Encounter: Payer: Self-pay | Admitting: Emergency Medicine

## 2017-04-02 ENCOUNTER — Emergency Department
Admission: EM | Admit: 2017-04-02 | Discharge: 2017-04-02 | Disposition: A | Discharge status: Home / Self Care | Payer: 59 | Attending: Emergency Medicine | Admitting: Emergency Medicine

## 2017-04-02 ENCOUNTER — Emergency Department
Admission: EM | Admit: 2017-04-02 | Discharge: 2017-04-02 | Disposition: A | Discharge status: Home / Self Care | Payer: 59 | Source: Home / Self Care | Attending: Emergency Medicine | Admitting: Emergency Medicine

## 2017-04-02 DIAGNOSIS — K115 Sialolithiasis: Secondary | ICD-10-CM | POA: Insufficient documentation

## 2017-04-02 DIAGNOSIS — Z91040 Latex allergy status: Secondary | ICD-10-CM | POA: Insufficient documentation

## 2017-04-02 DIAGNOSIS — Z79899 Other long term (current) drug therapy: Secondary | ICD-10-CM | POA: Insufficient documentation

## 2017-04-02 DIAGNOSIS — L0211 Cutaneous abscess of neck: Secondary | ICD-10-CM | POA: Insufficient documentation

## 2017-04-02 DIAGNOSIS — R070 Pain in throat: Secondary | ICD-10-CM | POA: Diagnosis present

## 2017-04-02 DIAGNOSIS — F17210 Nicotine dependence, cigarettes, uncomplicated: Secondary | ICD-10-CM | POA: Insufficient documentation

## 2017-04-02 DIAGNOSIS — R591 Generalized enlarged lymph nodes: Secondary | ICD-10-CM | POA: Diagnosis not present

## 2017-04-02 DIAGNOSIS — L0291 Cutaneous abscess, unspecified: Secondary | ICD-10-CM

## 2017-04-02 DIAGNOSIS — H6001 Abscess of right external ear: Secondary | ICD-10-CM | POA: Insufficient documentation

## 2017-04-02 DIAGNOSIS — R590 Localized enlarged lymph nodes: Secondary | ICD-10-CM | POA: Diagnosis not present

## 2017-04-02 LAB — URINALYSIS, ROUTINE W REFLEX MICROSCOPIC
Bilirubin Urine: NEGATIVE
Glucose, UA: NEGATIVE mg/dL
HGB URINE DIPSTICK: NEGATIVE
KETONES UR: NEGATIVE mg/dL
NITRITE: NEGATIVE
Protein, ur: NEGATIVE mg/dL
Specific Gravity, Urine: 1.004 — ABNORMAL LOW (ref 1.005–1.030)
pH: 5 (ref 5.0–8.0)

## 2017-04-02 LAB — CBC WITH DIFFERENTIAL/PLATELET
BASOS PCT: 0 %
Basophils Absolute: 0.1 10*3/uL (ref 0–0.1)
EOS ABS: 0.1 10*3/uL (ref 0–0.7)
EOS PCT: 1 %
HCT: 38.1 % (ref 35.0–47.0)
Hemoglobin: 13.3 g/dL (ref 12.0–16.0)
LYMPHS ABS: 1.6 10*3/uL (ref 1.0–3.6)
Lymphocytes Relative: 11 %
MCH: 31.9 pg (ref 26.0–34.0)
MCHC: 34.9 g/dL (ref 32.0–36.0)
MCV: 91.3 fL (ref 80.0–100.0)
MONO ABS: 0.9 10*3/uL (ref 0.2–0.9)
MONOS PCT: 6 %
Neutro Abs: 11.5 10*3/uL — ABNORMAL HIGH (ref 1.4–6.5)
Neutrophils Relative %: 82 %
PLATELETS: 264 10*3/uL (ref 150–440)
RBC: 4.17 MIL/uL (ref 3.80–5.20)
RDW: 14 % (ref 11.5–14.5)
WBC: 14.1 10*3/uL — ABNORMAL HIGH (ref 3.6–11.0)

## 2017-04-02 LAB — BASIC METABOLIC PANEL
ANION GAP: 9 (ref 5–15)
BUN: 10 mg/dL (ref 6–20)
CHLORIDE: 102 mmol/L (ref 101–111)
CO2: 22 mmol/L (ref 22–32)
Calcium: 8.9 mg/dL (ref 8.9–10.3)
Creatinine, Ser: 0.8 mg/dL (ref 0.44–1.00)
Glucose, Bld: 95 mg/dL (ref 65–99)
POTASSIUM: 3.5 mmol/L (ref 3.5–5.1)
SODIUM: 133 mmol/L — AB (ref 135–145)

## 2017-04-02 LAB — POCT PREGNANCY, URINE: Preg Test, Ur: NEGATIVE

## 2017-04-02 MED ORDER — LIDOCAINE-EPINEPHRINE 1 %-1:100000 IJ SOLN
10.0000 mL | Freq: Once | INTRAMUSCULAR | Status: DC
  Filled 2017-04-02 (×2): qty 10

## 2017-04-02 MED ORDER — DEXAMETHASONE SODIUM PHOSPHATE 4 MG/ML IJ SOLN
10.0000 mg | Freq: Once | INTRAMUSCULAR | Status: AC
  Administered 2017-04-02: 10 mg via INTRAVENOUS
  Filled 2017-04-02: qty 3

## 2017-04-02 MED ORDER — ACETAMINOPHEN 325 MG PO TABS
650.0000 mg | ORAL_TABLET | Freq: Once | ORAL | Status: AC
  Administered 2017-04-02: 650 mg via ORAL
  Filled 2017-04-02: qty 2

## 2017-04-02 MED ORDER — PREDNISONE 20 MG PO TABS: 60.0000 mg | ORAL_TABLET | Freq: Every day | ORAL | 0 refills | Status: AC

## 2017-04-02 MED ORDER — OXYCODONE-ACETAMINOPHEN 5-325 MG PO TABS: 1.0000 | ORAL_TABLET | Freq: Four times a day (QID) | ORAL | 0 refills | Status: DC | PRN

## 2017-04-02 MED ORDER — SULFAMETHOXAZOLE-TRIMETHOPRIM 800-160 MG PO TABS: 1.0000 | ORAL_TABLET | Freq: Two times a day (BID) | ORAL | 0 refills | Status: DC

## 2017-04-02 MED ORDER — CLINDAMYCIN PHOSPHATE 600 MG/50ML IV SOLN
600.0000 mg | Freq: Once | INTRAVENOUS | Status: AC
  Administered 2017-04-02: 600 mg via INTRAVENOUS
  Filled 2017-04-02: qty 50

## 2017-04-02 MED ORDER — MUPIROCIN 2 % EX OINT: TOPICAL_OINTMENT | CUTANEOUS | 0 refills | Status: DC

## 2017-04-02 MED ORDER — KETOROLAC TROMETHAMINE 30 MG/ML IJ SOLN
15.0000 mg | Freq: Once | INTRAMUSCULAR | Status: AC
  Administered 2017-04-02: 15 mg via INTRAVENOUS
  Filled 2017-04-02: qty 1

## 2017-04-02 MED ORDER — OXYCODONE HCL 5 MG PO TABS
5.0000 mg | ORAL_TABLET | Freq: Once | ORAL | Status: AC
  Administered 2017-04-02: 5 mg via ORAL
  Filled 2017-04-02: qty 1

## 2017-04-02 MED ORDER — IBUPROFEN 600 MG PO TABS: 600.0000 mg | ORAL_TABLET | Freq: Four times a day (QID) | ORAL | 0 refills | Status: DC | PRN

## 2017-04-02 MED ORDER — SODIUM CHLORIDE 0.9 % IV BOLUS (SEPSIS)
1000.0000 mL | Freq: Once | INTRAVENOUS | Status: AC
  Administered 2017-04-02: 1000 mL via INTRAVENOUS

## 2017-04-02 MED ORDER — SULFAMETHOXAZOLE-TRIMETHOPRIM 800-160 MG PO TABS
1.0000 | ORAL_TABLET | Freq: Once | ORAL | Status: AC
  Administered 2017-04-02: 1 via ORAL
  Filled 2017-04-02: qty 1

## 2017-04-02 MED ORDER — LIDOCAINE-EPINEPHRINE (PF) 1 %-1:200000 IJ SOLN: 30.0000 mL | Freq: Once | INTRAMUSCULAR | Status: DC

## 2017-04-02 MED ORDER — OXYCODONE-ACETAMINOPHEN 5-325 MG PO TABS
2.0000 | ORAL_TABLET | Freq: Once | ORAL | Status: AC
  Administered 2017-04-02: 2 via ORAL
  Filled 2017-04-02: qty 2

## 2017-04-02 MED ORDER — IOPAMIDOL (ISOVUE-300) INJECTION 61%
75.0000 mL | Freq: Once | INTRAVENOUS | Status: AC | PRN
  Administered 2017-04-02: 75 mL via INTRAVENOUS

## 2017-04-02 NOTE — ED Provider Notes (Signed)
[facility] Emergency Department Provider Note       Time seen: ----------------------------------------- 7:04 AM on 04/02/2017 -----------------------------------------     I have reviewed the triage vital signs and the nursing notes.   HISTORY   Chief Complaint Abscess    HPI Sarah Woods is a 31 y.o. female who presents to the ED for an abscess behind her right ear that started on Saturday. Patient states since then her glands have started swelling and she states she is running a low-grade fever. She has pain on the right side of her neck and she states she is having mucus in the back of her throat. She states she has not had abscesses before.   Past Medical History:  Diagnosis Date  . Anxiety   . Bronchitis 06/2016  . Ear infection   . Family history of adverse reaction to anesthesia    SISTER-OXYGEN DROPPED DURING CS  . Headache    MIGRAINES  . Pneumonia   . PVC (premature ventricular contraction)     There are no active problems to display for this patient.   Past Surgical History:  Procedure Laterality Date  . CESAREAN SECTION    . HEMORRHOID SURGERY N/A 11/23/2016   Procedure: HEMORRHOIDECTOMY;  Surgeon: Leonie Green, MD;  Location: [facility] ORS;  Service: General;  Laterality: N/A;  . SPHINCTEROTOMY N/A 11/23/2016   Procedure: Joan Mayans;  Surgeon: Leonie Green, MD;  Location: [facility] ORS;  Service: General;  Laterality: N/A;  . TONSILLECTOMY    . tubes in ears      Allergies Latex and Other  Social History Social History  Substance Use Topics  . Smoking status: Current Some Day Smoker    Packs/day: 1.00    Years: 13.00    Types: Cigarettes  . Smokeless tobacco: Never Used  . Alcohol use No    Review of Systems Constitutional: positive for fever Eyes: Negative for vision changes ENT:  Negative for congestion, positive for drainage, abscess on the right ear Cardiovascular: Negative for chest  pain. Respiratory: Negative for shortness of breath. Gastrointestinal: Negative for abdominal pain, vomiting and diarrhea. Musculoskeletal: positive for neck pain Skin: positive for abscess behind the right ear Neurological: Negative for headaches, focal weakness or numbness.  All systems negative/normal/unremarkable except as stated in the HPI  ____________________________________________   PHYSICAL EXAM:  VITAL SIGNS: ED Triage Vitals  Enc Vitals Group     BP 04/02/17 0637 123/82     Pulse Rate 04/02/17 0637 (!) 112     Resp 04/02/17 0637 18     Temp 04/02/17 0637 98.3 F (36.8 C)     Temp Source 04/02/17 0637 Oral     SpO2 04/02/17 0637 99 %     Weight 04/02/17 0635 184 lb (83.5 kg)     Height 04/02/17 0635 5\' 5"  (1.651 m)     Head Circumference --      Peak Flow --      Pain Score 04/02/17 0635 8     Pain Loc --      Pain Edu? --      Excl. in [place]? --     Constitutional: Alert and oriented. Well appearing and in no distress. Eyes: Conjunctivae are normal. Normal extraocular movements. ENT   Head: Normocephalic and atraumatic.\      Ears: there is a small abscess behind the right ear with tenderness and fluctuance. Approximately 3 cm in diameter   Nose: No congestion/rhinnorhea.   Mouth/Throat: Mucous  membranes are moist.no erythema   Neck: No stridor. Musculoskeletal: tenderness in the right. Tenderness in the right periauricular area Neurologic:  Normal speech and language. No gross focal neurologic deficits are appreciated.  Skin:  erythema, induration and fluctuance posterior to the right ear. Psychiatric: Mood and affect are normal. Speech and behavior are normal.  ____________________________________________  ED COURSE:  Pertinent labs & imaging results that were available during my care of the patient were reviewed by me and considered in my medical decision making (see chart for details). Patient presents for a skin abscess, patient will be  given pain medicine and undergo incision and drainage   .Marland KitchenIncision and Drainage Date/Time: 04/02/2017 7:07 AM Performed by: Earleen Newport Authorized by: Lenise Arena E   Consent:    Consent obtained:  Verbal   Consent given by:  Patient Location:    Type:  Abscess   Size:  3   Location:  Head   Head location:  R external ear Pre-procedure details:    Skin preparation:  Betadine Anesthesia (see MAR for exact dosages):    Anesthesia method:  Local infiltration   Local anesthetic:  Lidocaine 1% WITH epi Procedure type:    Complexity:  Simple Procedure details:    Needle aspiration: no     Incision types:  Single straight   Incision depth:  Subcutaneous   Scalpel blade:  11   Wound management:  Probed and deloculated   Drainage:  Purulent   Drainage amount:  Moderate   Wound treatment:  Drain placed   Packing materials:  1/4 in gauze Post-procedure details:    Patient tolerance of procedure:  Tolerated well, no immediate complications   ___________________________________________  FINAL ASSESSMENT AND PLAN  Abscess   Plan: Patient had presented for an abscess behind her right ear that was successfully incised and drained. She was given a first dose of Septra treatment for infection. She'll be discharged with a short supply of pain medicine and antibiotics.   Earleen Newport, MD   Note: This note was generated in part or whole with voice recognition software. Voice recognition is usually quite accurate but there are transcription errors that can and very often do occur. I apologize for any typographical errors that were not detected and corrected.     Earleen Newport, MD 04/02/17 [phone]

## 2017-04-02 NOTE — ED Notes (Signed)
Pt presents to ED with c/o abscess behind the RIGHT ear x2-3 days. Pt reports yellow drainage with pain radiating down into the RIGHT side of her neck. Pt reports a sore throat, but denies difficulty breathing or swallowing.

## 2017-04-02 NOTE — ED Triage Notes (Signed)
Pt states was seen here this morning had an I&D this AM performed by Dr. Jimmye Norman to R side of her throat. Pt states now she is having difficulty swallowing. C/o swelling to L side of her throat. Pt is maintaining her own saliva, and denies any respiratory distress at this time. Pt is able to speak in complete sentences without difficulty.

## 2017-04-02 NOTE — ED Triage Notes (Signed)
Patient states that she developed an abscess behind right ear on Saturday. Patient states that since her glands have started swelling. Patient reports low grade fever last night.

## 2017-04-02 NOTE — Discharge Instructions (Signed)
continue to take the antibiotics prescribed this morning. Takes 600 mg of ibuprofen every 6 hours with a snack or meal. Take 1 Percocet as needed for pain every 6 hours. Take steroids once a day for the next 4 days as prescribed. Follow up with your primary care doctor. Return to the emergency room if you're having difficulty breathing, difficulty swallowing, difficulty opening her mouth, fever, neck stiffness, or any other symptoms concerning to you.

## 2017-04-02 NOTE — ED Provider Notes (Signed)
[facility] Emergency Department Provider Note  ____________________________________________  Time seen: Approximately 8:40 PM  I have reviewed the triage vital signs and the nursing notes.   HISTORY  Chief Complaint Other (Painful Swallowing)   HPI Sarah Woods is a 31 y.o. female who presents for evaluation of neck pain and swelling. Patient was seen here for an abscess located behind her right ear for which she underwent I&D. She was started on pain medication and Bactrim and discharged home. She noticed that now there is some lymph nodes that are swollen on the left side as well and she says that she's been having difficulty to swallow. She is able to swallow, she is handling her saliva without difficulty, she is able to swallow her pain medications but she feels pressure around her throat. No fever since yesterday morning. Patient is not immune suppressed. She denies sore throat, shortness of breath, cough, neck stiffness.  Past Medical History:  Diagnosis Date  . Anxiety   . Bronchitis 06/2016  . Ear infection   . Family history of adverse reaction to anesthesia    SISTER-OXYGEN DROPPED DURING CS  . Headache    MIGRAINES  . Pneumonia   . PVC (premature ventricular contraction)     There are no active problems to display for this patient.   Past Surgical History:  Procedure Laterality Date  . CESAREAN SECTION    . HEMORRHOID SURGERY N/A 11/23/2016   Procedure: HEMORRHOIDECTOMY;  Surgeon: Leonie Green, MD;  Location: [facility] ORS;  Service: General;  Laterality: N/A;  . SPHINCTEROTOMY N/A 11/23/2016   Procedure: Joan Mayans;  Surgeon: Leonie Green, MD;  Location: [facility] ORS;  Service: General;  Laterality: N/A;  . TONSILLECTOMY    . tubes in ears      Prior to Admission medications   Medication Sig Start Date End Date Taking? Authorizing Provider  cholecalciferol (VITAMIN D) 1000 units tablet Take 1,000 Units by mouth 2  (two) times daily.    [provider]  ibuprofen (ADVIL,MOTRIN) 600 MG tablet Take 1 tablet (600 mg total) by mouth every 6 (six) hours as needed. 04/02/17   Rudene Re, MD  lidocaine (XYLOCAINE) 2 % solution Use as directed 10 mLs in the mouth or throat 2 (two) times daily as needed (sore throat,). Gargle then spit, use as needed for sore throat. Patient not taking: Reported on 11/14/2016 01/17/15   Marylene Land, NP  mupirocin ointment Drue Stager) 2 % Apply to affected area 3 times daily 04/02/17 04/02/18  Earleen Newport, MD  NEOMYCIN-POLYMYXIN-HYDROCORTISONE (CORTISPORIN) 1 % SOLN otic solution instill 3 to 4 drops three times a day INTO AFFECTED EAR AS NEEDED FOR ITCHING 10/30/16   [provider]  oxyCODONE-acetaminophen (PERCOCET) 5-325 MG tablet Take 1-2 tablets by mouth every 6 (six) hours as needed. 04/02/17   Earleen Newport, MD  oxyCODONE-acetaminophen (PERCOCET/ROXICET) 5-325 MG tablet Take 1-2 tablets by mouth every 4 (four) hours as needed for severe pain. Patient not taking: Reported on 11/14/2016 05/27/15   Sherrie George B, FNP  oxyCODONE-acetaminophen (ROXICET) 5-325 MG tablet Take 1-2 tablets by mouth every 4 (four) hours as needed for moderate pain. 11/23/16   Leonie Green, MD  predniSONE (DELTASONE) 20 MG tablet Take 3 tablets (60 mg total) by mouth daily. 04/02/17 04/06/17  Rudene Re, MD  sulfamethoxazole-trimethoprim (BACTRIM DS) 800-160 MG tablet Take 1 tablet by mouth 2 (two) times daily. 04/02/17   Earleen Newport, MD  tiotropium [facility]) 18  MCG inhalation capsule Place 18 mcg into inhaler and inhale daily as needed (shortness of breath).     [provider]    Allergies Latex and Other  History reviewed. No pertinent family history.  Social History Social History  Substance Use Topics  . Smoking status: Current Some Day Smoker    Packs/day: 1.00    Years: 13.00    Types: Cigarettes  . Smokeless tobacco:  Never Used  . Alcohol use No    Review of Systems  Constitutional: Negative for fever. Eyes: Negative for visual changes. ENT: Negative for sore throat. Neck: + neck pain, swollen glands and difficulty swallowing  Cardiovascular: Negative for chest pain. Respiratory: Negative for shortness of breath. Gastrointestinal: Negative for abdominal pain, vomiting or diarrhea. Genitourinary: Negative for dysuria. Musculoskeletal: Negative for back pain. Skin: Negative for rash. Neurological: Negative for headaches, weakness or numbness. Psych: No SI or HI  ____________________________________________   PHYSICAL EXAM:  VITAL SIGNS: ED Triage Vitals  Enc Vitals Group     BP 04/02/17 1724 126/70     Pulse Rate 04/02/17 1724 (!) 106     Resp 04/02/17 1724 18     Temp 04/02/17 1724 98.8 F (37.1 C)     Temp Source 04/02/17 1724 Oral     SpO2 04/02/17 1724 98 %     Weight 04/02/17 1723 184 lb (83.5 kg)     Height 04/02/17 1723 5\' 5"  (1.651 m)     Head Circumference --      Peak Flow --      Pain Score 04/02/17 1722 4     Pain Loc --      Pain Edu? --      Excl. in [place]? --     Constitutional: Alert and oriented. Well appearing and in no apparent distress. HEENT:      Head: Normocephalic and atraumatic.         Eyes: Conjunctivae are normal. Sclera is non-icteric.       Mouth/Throat: Mucous membranes are moist. oropharynx is clear with no tonsillar hypertrophy or exudate, airways patent, no stridor, no trismus, floor of the mouth is soft and nontender      Neck: Supple with no signs of meningismus. patient has several tender and swollen lymph nodes on bilateral neck, I&D site looks well with minimal erythema and pack in place       Ear: TMs are visualized and clear Cardiovascular: Regular rate and rhythm. No murmurs, gallops, or rubs. 2+ symmetrical distal pulses are present in all extremities. No JVD. Respiratory: Normal respiratory effort. Lungs are clear to auscultation  bilaterally. No wheezes, crackles, or rhonchi.  Musculoskeletal: Nontender with normal range of motion in all extremities. No edema, cyanosis, or erythema of extremities. Neurologic: Normal speech and language. Face is symmetric. Moving all extremities. No gross focal neurologic deficits are appreciated. Skin: Skin is warm, dry and intact. No rash noted. Psychiatric: Mood and affect are normal. Speech and behavior are normal.  ____________________________________________   LABS (all labs ordered are listed, but only abnormal results are displayed)  Labs Reviewed  CBC WITH DIFFERENTIAL/PLATELET - Abnormal; Notable for the following:       Result Value   WBC 14.1 (*)    Neutro Abs 11.5 (*)    All other components within normal limits  BASIC METABOLIC PANEL - Abnormal; Notable for the following:    Sodium 133 (*)    All other components within normal limits  URINALYSIS, ROUTINE W REFLEX  MICROSCOPIC - Abnormal; Notable for the following:    Color, Urine STRAW (*)    APPearance CLEAR (*)    Specific Gravity, Urine 1.004 (*)    Leukocytes, UA TRACE (*)    Bacteria, UA MANY (*)    Squamous Epithelial / LPF 0-5 (*)    All other components within normal limits  POC URINE PREG, ED  POCT PREGNANCY, URINE   ____________________________________________  EKG  none  ____________________________________________  RADIOLOGY  CT neck:  1. Mildly enlarged bilateral cervical lymph nodes as above, nonspecific, but may be reactive in nature due to underlying infectious or inflammatory process. 2. No other acute abnormality identified within the neck. No imaging findings to suggest acute tonsillitis or epiglottitis. No abscess or drainable fluid collection. 3. Left parotid sialolithiasis. ____________________________________________   PROCEDURES  Procedure(s) performed: None Procedures Critical Care performed:  None ____________________________________________   INITIAL IMPRESSION /  ASSESSMENT AND PLAN / ED COURSE  31 y.o. female who presents for evaluation of neck pain and swelling.patient is well-appearing, in no distress, afebrile, no meningeal signs, oropharynx is clear, TMs are clear, she has diffuse shotty cervical lymphadenopathy, floor of the mouth is soft and nontender, there is no trismus, airway is patent, no stridor, patient is swallowing with no difficulty, handling her saliva, no respiratory distress. CT showing bilateral lymphadenopathy and left parotid sialolithiasis. We'll give Decadron, Toradol, IV clindamycin and reassess.    _________________________ 10:00 PM on 04/02/2017 -----------------------------------------  patient feels markedly improved after steroids and anti-inflammatories. She received a dose of IV clindamycin. She is to be discharged home on steroid, ibuprofen, continue Bactrim and Percocet were given to her this morning. Discussed return precautions for difficulty breathing, swallowing, neck stiffness, fever and recommended return to the emergency room. For her parotid stone recommended sour lemon drops. Recommended close f/u with PCP.  Pertinent labs & imaging results that were available during my care of the patient were reviewed by me and considered in my medical decision making (see chart for details).    ____________________________________________   FINAL CLINICAL IMPRESSION(S) / ED DIAGNOSES  Final diagnoses:  Cervical lymphadenopathy  Neck abscess  Sialolithiasis      NEW MEDICATIONS STARTED DURING THIS VISIT:  New Prescriptions   IBUPROFEN (ADVIL,MOTRIN) 600 MG TABLET    Take 1 tablet (600 mg total) by mouth every 6 (six) hours as needed.   PREDNISONE (DELTASONE) 20 MG TABLET    Take 3 tablets (60 mg total) by mouth daily.     Note:  This document was prepared using Dragon voice recognition software and may include unintentional dictation errors.    Rudene Re, MD 04/02/17 2201

## 2017-05-01 DIAGNOSIS — R0789 Other chest pain: Secondary | ICD-10-CM | POA: Diagnosis not present

## 2017-05-01 DIAGNOSIS — J209 Acute bronchitis, unspecified: Secondary | ICD-10-CM | POA: Diagnosis not present

## 2017-07-12 ENCOUNTER — Emergency Department
Admission: EM | Admit: 2017-07-12 | Discharge: 2017-07-12 | Disposition: A | Discharge status: Home / Self Care | Payer: 59 | Attending: Emergency Medicine | Admitting: Emergency Medicine

## 2017-07-12 ENCOUNTER — Encounter: Payer: Self-pay | Admitting: Emergency Medicine

## 2017-07-12 DIAGNOSIS — T50905A Adverse effect of unspecified drugs, medicaments and biological substances, initial encounter: Secondary | ICD-10-CM | POA: Insufficient documentation

## 2017-07-12 DIAGNOSIS — Z79899 Other long term (current) drug therapy: Secondary | ICD-10-CM | POA: Diagnosis not present

## 2017-07-12 DIAGNOSIS — F17210 Nicotine dependence, cigarettes, uncomplicated: Secondary | ICD-10-CM | POA: Insufficient documentation

## 2017-07-12 DIAGNOSIS — L03115 Cellulitis of right lower limb: Secondary | ICD-10-CM | POA: Insufficient documentation

## 2017-07-12 DIAGNOSIS — T887XXA Unspecified adverse effect of drug or medicament, initial encounter: Secondary | ICD-10-CM | POA: Diagnosis not present

## 2017-07-12 DIAGNOSIS — R200 Anesthesia of skin: Secondary | ICD-10-CM | POA: Diagnosis present

## 2017-07-12 MED ORDER — DOXYCYCLINE HYCLATE 100 MG PO CAPS: 100.0000 mg | ORAL_CAPSULE | Freq: Two times a day (BID) | ORAL | 0 refills | Status: DC

## 2017-07-12 NOTE — ED Provider Notes (Signed)
[facility] Emergency Department Provider Note  ____________________________________________   First MD Initiated Contact with Patient 07/12/17 2123     (approximate)  I have reviewed the triage vital signs and the nursing notes.   HISTORY  Chief Complaint Leg Swelling    HPI Sarah Woods is a 31 y.o. female complains of a red swollen area like cellulitis on the posterior thigh, states she took a leftover antibiotic that she had, was Bactrim, she states after she took the medication she noticed that her right leg started to burn and tingle, she states the right leg feels numb, she denies any chest pain or shortness of breath, she denies any swelling of the lips or the mouth, she has never had difficulty with this medication before  Past Medical History:  Diagnosis Date  . Anxiety   . Bronchitis 06/2016  . Ear infection   . Family history of adverse reaction to anesthesia    SISTER-OXYGEN DROPPED DURING CS  . Headache    MIGRAINES  . Pneumonia   . PVC (premature ventricular contraction)     There are no active problems to display for this patient.   Past Surgical History:  Procedure Laterality Date  . CESAREAN SECTION    . HEMORRHOID SURGERY N/A 11/23/2016   Procedure: HEMORRHOIDECTOMY;  Surgeon: Leonie Green, MD;  Location: [facility] ORS;  Service: General;  Laterality: N/A;  . SPHINCTEROTOMY N/A 11/23/2016   Procedure: Joan Mayans;  Surgeon: Leonie Green, MD;  Location: [facility] ORS;  Service: General;  Laterality: N/A;  . TONSILLECTOMY    . tubes in ears      Prior to Admission medications   Medication Sig Start Date End Date Taking? Authorizing Provider  cholecalciferol (VITAMIN D) 1000 units tablet Take 1,000 Units by mouth 2 (two) times daily.    [provider]  doxycycline (VIBRAMYCIN) 100 MG capsule Take 1 capsule (100 mg total) by mouth 2 (two) times daily. 07/12/17   Hazelene Doten, Linden Dolin, PA-C  ibuprofen  (ADVIL,MOTRIN) 600 MG tablet Take 1 tablet (600 mg total) by mouth every 6 (six) hours as needed. 04/02/17   Rudene Re, MD  mupirocin ointment Drue Stager) 2 % Apply to affected area 3 times daily 04/02/17 04/02/18  Earleen Newport, MD  NEOMYCIN-POLYMYXIN-HYDROCORTISONE (CORTISPORIN) 1 % SOLN otic solution instill 3 to 4 drops three times a day INTO AFFECTED EAR AS NEEDED FOR ITCHING 10/30/16   [provider]  tiotropium (SPIRIVA) 18 MCG inhalation capsule Place 18 mcg into inhaler and inhale daily as needed (shortness of breath).     [provider]    Allergies Latex and Other  History reviewed. No pertinent family history.  Social History Social History   Tobacco Use  . Smoking status: Current Some Day Smoker    Packs/day: 1.00    Years: 13.00    Pack years: 13.00    Types: Cigarettes  . Smokeless tobacco: Never Used  Substance Use Topics  . Alcohol use: No  . Drug use: No    Review of Systems  Constitutional: No fever/chills Eyes: No visual changes. ENT: No sore throat. Respiratory: Denies cough Genitourinary: Negative for dysuria. Musculoskeletal: Negative for back pain.  Positive for tingling in the right lower leg Skin: Positive for rash    ____________________________________________   PHYSICAL EXAM:  VITAL SIGNS: ED Triage Vitals [07/12/17 2109]  Enc Vitals Group     BP 116/63     Pulse Rate (!) 112  Resp 16     Temp 99.8 F (37.7 C)     Temp Source Oral     SpO2 100 %     Weight 185 lb (83.9 kg)     Height 5\' 5"  (1.651 m)     Head Circumference      Peak Flow      Pain Score 1     Pain Loc      Pain Edu?      Excl. in [place]?     Constitutional: Alert and oriented. Well appearing and in no acute distress. Eyes: Conjunctivae are normal.  Head: Atraumatic. Nose: No congestion/rhinnorhea. Mouth/Throat: Mucous membranes are moist.  Throat is normal there is no swelling Cardiovascular: Normal rate, regular rhythm.   Heart sounds are normal Respiratory: Normal respiratory effort.  No retractions, lungs are clear to auscultation GU: deferred Musculoskeletal: FROM all extremities, warm and well perfused, neurovascular is intact Neurologic:  Normal speech and language.  Skin:  Skin is warm, dry and intact.  Positive for a 3 inch circular red area on the posterior thigh, with a red center, there is no induration or fluctuance noted, neurovascular is intact Psychiatric: Mood and affect are normal. Speech and behavior are normal.  ____________________________________________   LABS (all labs ordered are listed, but only abnormal results are displayed)  Labs Reviewed - No data to display ____________________________________________   ____________________________________________  RADIOLOGY    ____________________________________________   PROCEDURES  Procedure(s) performed: No      ____________________________________________   INITIAL IMPRESSION / ASSESSMENT AND PLAN / ED COURSE  Pertinent labs & imaging results that were available during my care of the patient were reviewed by me and considered in my medical decision making (see chart for details).  Patient is a 31 year old female complaining of a cellulitis to the posterior right thigh, she took an old Bactrim earlier today and now has numbness and tingling, she is never had difficulty with this medication before, she denies chest pain or shortness of breath, on physical exam the area is red and swollen, neurovascular is intact, diagnosis is cellulitis, patient was instructed to stop the Bactrim as the numbness and tingling may be a reaction of that with the Wellbutrin, she was given a prescription for doxycycline 100 mg twice daily for 10 days for the cellulitis, she is to take Benadryl for the reaction to the Bactrim, she is to apply warm compress to the area, she is to return to the emergency department or her regular doctor if she is  worsening, she states she understands will comply with our recommendations, she was discharged in stable condition      ____________________________________________   FINAL CLINICAL IMPRESSION(S) / ED DIAGNOSES  Final diagnoses:  Cellulitis of right lower extremity  Medication reaction, initial encounter      NEW MEDICATIONS STARTED DURING THIS VISIT:  This SmartLink is deprecated. Use AVSMEDLIST instead to display the medication list for a patient.   Note:  This document was prepared using Dragon voice recognition software and may include unintentional dictation errors.    Versie Starks, PA-C 07/12/17 2141    Harvest Dark, MD 07/12/17 2259

## 2017-07-12 NOTE — Discharge Instructions (Signed)
Follow-up with your regular doctor if you are not better in 3-5 days, if you are worsening please return to the emergency department, you need to notify people that you are allergic to sulfa medications, tell them the reaction was numbness and tingling, apply a warm compress to the area and take the doxycycline as prescribed, if the area is becoming more red and swollen return to the emergency department for additional evaluation

## 2017-07-12 NOTE — ED Triage Notes (Signed)
Pt comes into the Ed via POV c/o redness and swelling to the upper right leg and she is now having numbness present in the leg.  Patient states it feels as though her leg is "asleep".  Patient states the symptoms started yesterday and so she started taking some antibiotics that she had at her house.  Patient in NAD at this time and was ambulatory to triage.

## 2017-08-22 DIAGNOSIS — H5203 Hypermetropia, bilateral: Secondary | ICD-10-CM | POA: Diagnosis not present

## 2017-08-22 DIAGNOSIS — H52223 Regular astigmatism, bilateral: Secondary | ICD-10-CM | POA: Diagnosis not present

## 2017-11-15 DIAGNOSIS — L700 Acne vulgaris: Secondary | ICD-10-CM | POA: Diagnosis not present

## 2017-11-15 DIAGNOSIS — L02212 Cutaneous abscess of back [any part, except buttock]: Secondary | ICD-10-CM | POA: Diagnosis not present

## 2017-11-15 DIAGNOSIS — L720 Epidermal cyst: Secondary | ICD-10-CM | POA: Diagnosis not present

## 2017-12-10 ENCOUNTER — Encounter: Payer: Self-pay | Admitting: Emergency Medicine

## 2017-12-10 ENCOUNTER — Other Ambulatory Visit: Payer: Self-pay

## 2017-12-10 ENCOUNTER — Emergency Department
Admission: EM | Admit: 2017-12-10 | Discharge: 2017-12-10 | Disposition: A | Discharge status: Home / Self Care | Payer: 59 | Attending: Emergency Medicine | Admitting: Emergency Medicine

## 2017-12-10 DIAGNOSIS — S76311A Strain of muscle, fascia and tendon of the posterior muscle group at thigh level, right thigh, initial encounter: Secondary | ICD-10-CM | POA: Insufficient documentation

## 2017-12-10 DIAGNOSIS — Y9389 Activity, other specified: Secondary | ICD-10-CM | POA: Insufficient documentation

## 2017-12-10 DIAGNOSIS — X500XXA Overexertion from strenuous movement or load, initial encounter: Secondary | ICD-10-CM | POA: Diagnosis not present

## 2017-12-10 DIAGNOSIS — S76319A Strain of muscle, fascia and tendon of the posterior muscle group at thigh level, unspecified thigh, initial encounter: Secondary | ICD-10-CM

## 2017-12-10 DIAGNOSIS — F17210 Nicotine dependence, cigarettes, uncomplicated: Secondary | ICD-10-CM | POA: Insufficient documentation

## 2017-12-10 DIAGNOSIS — Y999 Unspecified external cause status: Secondary | ICD-10-CM | POA: Diagnosis not present

## 2017-12-10 DIAGNOSIS — S79921A Unspecified injury of right thigh, initial encounter: Secondary | ICD-10-CM | POA: Diagnosis present

## 2017-12-10 DIAGNOSIS — M545 Low back pain: Secondary | ICD-10-CM | POA: Insufficient documentation

## 2017-12-10 DIAGNOSIS — Y929 Unspecified place or not applicable: Secondary | ICD-10-CM | POA: Insufficient documentation

## 2017-12-10 MED ORDER — METHYLPREDNISOLONE 4 MG PO TBPK: ORAL_TABLET | ORAL | 0 refills | Status: AC

## 2017-12-10 MED ORDER — BACLOFEN 10 MG PO TABS: 10.0000 mg | ORAL_TABLET | Freq: Every day | ORAL | 1 refills | Status: AC

## 2017-12-10 NOTE — ED Triage Notes (Signed)
States was emptying water by bucketful for about 4 hours on Saturday. Pain R buttock and down back of thigh.

## 2017-12-10 NOTE — ED Notes (Signed)
See triage note  States she was bailing out some water from pool on Saturday   Developed some leg soreness on Sunday  Now having increased pain to right lower back which is moving into right leg  Ambulates slowly to treatment room

## 2017-12-10 NOTE — ED Provider Notes (Signed)
[facility] Emergency Department Provider Note  ____________________________________________   First MD Initiated Contact with Patient 12/10/17 1611     (approximate)  I have reviewed the triage vital signs and the nursing notes.   HISTORY  Chief Complaint Back Pain and Leg Pain    HPI Sarah Woods is a 32 y.o. female presents emergency department complaining of lower back pain and right leg pain.  She states she was cleaning out the pool for the summer and the drain would not completely empty at the pool so she had to remove the water with the bucket.  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  She states that then she began to have lower back pain and severe right leg pain.  She states it feels bruised.  States the other symptoms are getting better but that the right hamstring has continued to increase in pain.  She has taken over-the-counter ibuprofen and Tylenol without relief.  She did use muscle rub without relief.  She denies numbness or tingling.  She denies chest pain or shortness of breath.  Past Medical History:  Diagnosis Date  . Anxiety   . Bronchitis 06/2016  . Ear infection   . Family history of adverse reaction to anesthesia    SISTER-OXYGEN DROPPED DURING CS  . Headache    MIGRAINES  . Pneumonia   . PVC (premature ventricular contraction)     There are no active problems to display for this patient.   Past Surgical History:  Procedure Laterality Date  . CESAREAN SECTION    . HEMORRHOID SURGERY N/A 11/23/2016   Procedure: HEMORRHOIDECTOMY;  Surgeon: Leonie Green, MD;  Location: [facility] ORS;  Service: General;  Laterality: N/A;  . SPHINCTEROTOMY N/A 11/23/2016   Procedure: Joan Mayans;  Surgeon: Leonie Green, MD;  Location: [facility] ORS;  Service: General;  Laterality: N/A;  . TONSILLECTOMY    . tubes in ears      Prior to Admission medications   Medication Sig Start Date End Date Taking?  Authorizing Provider  baclofen (LIORESAL) 10 MG tablet Take 1 tablet (10 mg total) by mouth daily. 12/10/17 12/10/18  Caryn Section Linden Dolin, PA-C  cholecalciferol (VITAMIN D) 1000 units tablet Take 1,000 Units by mouth 2 (two) times daily.    [provider]  methylPREDNISolone (MEDROL DOSEPAK) 4 MG TBPK tablet Take 6 pills on day one then decrease by 1 pill each day 12/10/17   Versie Starks, PA-C  tiotropium (SPIRIVA) 18 MCG inhalation capsule Place 18 mcg into inhaler and inhale daily as needed (shortness of breath).     [provider]    Allergies Latex and Other  History reviewed. No pertinent family history.  Social History Social History   Tobacco Use  . Smoking status: Current Some Day Smoker    Packs/day: 1.00    Years: 13.00    Pack years: 13.00    Types: Cigarettes  . Smokeless tobacco: Never Used  Substance Use Topics  . Alcohol use: No  . Drug use: No    Review of Systems  Constitutional: No fever/chills Eyes: No visual changes. ENT: No sore throat. Respiratory: Denies cough Genitourinary: Negative for dysuria. Musculoskeletal: Positive for low back pain and right leg pain Skin: Negative for rash.    ____________________________________________   PHYSICAL EXAM:  VITAL SIGNS: ED Triage Vitals [12/10/17 1556]  Enc Vitals Group     BP 131/77     Pulse Rate (!) 105  Resp 20     Temp 98.7 F (37.1 C)     Temp Source Oral     SpO2 98 %     Weight 181 lb (82.1 kg)     Height 5\' 5"  (1.651 m)     Head Circumference      Peak Flow      Pain Score 5     Pain Loc      Pain Edu?      Excl. in [place]?     Constitutional: Alert and oriented. Well appearing and in no acute distress. Eyes: Conjunctivae are normal.  Head: Atraumatic. Nose: No congestion/rhinnorhea. Mouth/Throat: Mucous membranes are moist.   Cardiovascular: Normal rate, regular rhythm. Respiratory: Normal respiratory effort.  No retractions GU: deferred Musculoskeletal:  FROM all extremities, warm and well perfused.  Positive for right hamstring tenderness.  The calf is not tender.  There is no redness or swelling noted.  The lumbar spine is not tender.  The gluteal muscle on the right side is tender.  She is neurovascularly intact.  Pain is reproduced with movement. Neurologic:  Normal speech and language.  Skin:  Skin is warm, dry and intact. No rash noted. Psychiatric: Mood and affect are normal. Speech and behavior are normal.  ____________________________________________   LABS (all labs ordered are listed, but only abnormal results are displayed)  Labs Reviewed - No data to display ____________________________________________   ____________________________________________  RADIOLOGY    ____________________________________________   PROCEDURES  Procedure(s) performed: Ace wrap applied by the nursing staff  Procedures    ____________________________________________   INITIAL IMPRESSION / ASSESSMENT AND PLAN / ED COURSE  Pertinent labs & imaging results that were available during my care of the patient were reviewed by me and considered in my medical decision making (see chart for details).  Patient is a 32 year old female presents emergency department complaining of lower back pain with right leg pain after cleaning out her pool for 4 hours.  On physical exam the patient's lumbar spine is not tender.  The right hamstring is extremely tender at the insertion ends and in the middle.  She is neurovascularly intact.  The remainder the exam is unremarkable  Explained exam findings to the patient.  Told her she has a severe hamstring strain.  Instructed her to wear something tight light biking shorts with spandex.  She can try wearing the Ace wrap but I am have concerns that this will slide down her leg.  She states she understands and will try both.  She was given a prescription for Medrol Dosepak and baclofen to help with the muscle strain.   She is to apply ice at home.  She states she does not need a work noticed to the small office and the other nurse that works there will help her.  She was discharged in stable condition     As part of my medical decision making, I reviewed the following data within the [place] notes reviewed and incorporated, Old chart reviewed, Notes from prior ED visits and [place] Controlled Substance Database  ____________________________________________   FINAL CLINICAL IMPRESSION(S) / ED DIAGNOSES  Final diagnoses:  Hamstring strain, initial encounter      NEW MEDICATIONS STARTED DURING THIS VISIT:  Discharge Medication List as of 12/10/2017  4:33 PM    START taking these medications   Details  baclofen (LIORESAL) 10 MG tablet Take 1 tablet (10 mg total) by mouth daily., Starting Mon 12/10/2017, Until Tue 12/10/2018, Print  methylPREDNISolone (MEDROL DOSEPAK) 4 MG TBPK tablet Take 6 pills on day one then decrease by 1 pill each day, Print         Note:  This document was prepared using Dragon voice recognition software and may include unintentional dictation errors.    Versie Starks, PA-C 12/10/17 1724    Arta Silence, MD 12/11/17 [phone]

## 2017-12-10 NOTE — Discharge Instructions (Addendum)
Apply ice to the hamstring area.  Take medication as prescribed.  Wear something like spandex which would be tight on the thigh.  You can try using the Ace wrap but this may slide down.  Return to the emergency department if your worsening

## 2018-01-04 ENCOUNTER — Other Ambulatory Visit: Payer: Self-pay | Admitting: Internal Medicine

## 2018-01-04 DIAGNOSIS — R221 Localized swelling, mass and lump, neck: Secondary | ICD-10-CM

## 2018-01-07 ENCOUNTER — Ambulatory Visit
Admission: RE | Admit: 2018-01-07 | Discharge: 2018-01-07 | Disposition: A | Discharge status: Home / Self Care | Payer: 59 | Source: Ambulatory Visit | Attending: Internal Medicine | Admitting: Internal Medicine

## 2018-01-07 DIAGNOSIS — R221 Localized swelling, mass and lump, neck: Secondary | ICD-10-CM

## 2018-01-07 DIAGNOSIS — K115 Sialolithiasis: Secondary | ICD-10-CM | POA: Insufficient documentation

## 2018-01-07 DIAGNOSIS — R590 Localized enlarged lymph nodes: Secondary | ICD-10-CM | POA: Insufficient documentation

## 2018-01-07 DIAGNOSIS — R599 Enlarged lymph nodes, unspecified: Secondary | ICD-10-CM | POA: Diagnosis not present

## 2018-01-07 MED ORDER — IOPAMIDOL (ISOVUE-300) INJECTION 61%
75.0000 mL | Freq: Once | INTRAVENOUS | Status: AC | PRN
  Administered 2018-01-07: 75 mL via INTRAVENOUS

## 2018-01-09 DIAGNOSIS — Z0000 Encounter for general adult medical examination without abnormal findings: Secondary | ICD-10-CM | POA: Diagnosis not present

## 2018-03-20 DIAGNOSIS — L700 Acne vulgaris: Secondary | ICD-10-CM | POA: Diagnosis not present

## 2018-03-20 DIAGNOSIS — L732 Hidradenitis suppurativa: Secondary | ICD-10-CM | POA: Diagnosis not present

## 2018-04-02 DIAGNOSIS — I889 Nonspecific lymphadenitis, unspecified: Secondary | ICD-10-CM | POA: Diagnosis not present

## 2018-04-02 DIAGNOSIS — Z0000 Encounter for general adult medical examination without abnormal findings: Secondary | ICD-10-CM | POA: Diagnosis not present

## 2018-08-21 DIAGNOSIS — H5203 Hypermetropia, bilateral: Secondary | ICD-10-CM | POA: Diagnosis not present

## 2018-09-04 DIAGNOSIS — F419 Anxiety disorder, unspecified: Secondary | ICD-10-CM | POA: Diagnosis not present

## 2018-10-28 ENCOUNTER — Other Ambulatory Visit: Payer: Self-pay | Admitting: Cardiology

## 2018-10-28 ENCOUNTER — Ambulatory Visit
Admission: RE | Admit: 2018-10-28 | Discharge: 2018-10-28 | Disposition: A | Discharge status: Home / Self Care | Payer: 59 | Source: Ambulatory Visit | Attending: Internal Medicine | Admitting: Internal Medicine

## 2018-10-28 ENCOUNTER — Ambulatory Visit
Admission: RE | Admit: 2018-10-28 | Discharge: 2018-10-28 | Disposition: A | Discharge status: Home / Self Care | Payer: 59 | Source: Ambulatory Visit | Attending: Cardiology | Admitting: Cardiology

## 2018-10-28 ENCOUNTER — Other Ambulatory Visit: Payer: Self-pay

## 2018-10-28 DIAGNOSIS — R0602 Shortness of breath: Secondary | ICD-10-CM

## 2019-04-30 DIAGNOSIS — B379 Candidiasis, unspecified: Secondary | ICD-10-CM | POA: Diagnosis not present

## 2019-04-30 DIAGNOSIS — H9212 Otorrhea, left ear: Secondary | ICD-10-CM | POA: Diagnosis not present

## 2019-04-30 DIAGNOSIS — H9190 Unspecified hearing loss, unspecified ear: Secondary | ICD-10-CM | POA: Diagnosis not present

## 2019-05-14 DIAGNOSIS — E7800 Pure hypercholesterolemia, unspecified: Secondary | ICD-10-CM | POA: Diagnosis not present

## 2019-05-14 DIAGNOSIS — Z0000 Encounter for general adult medical examination without abnormal findings: Secondary | ICD-10-CM | POA: Diagnosis not present

## 2019-05-28 IMAGING — CR DG CHEST 2V
1 series · 2 of 2 positions shown · non-contrast
Comparison: Chest radiograph August 03, 2013 and chest CT August 03, 2013

CLINICAL DATA: Chest pain

EXAM:
CHEST  2 VIEW

[Series 1: dg chest 2 view · 0.14mm/px · 2 of 2 slices shown]
[im 1/2]
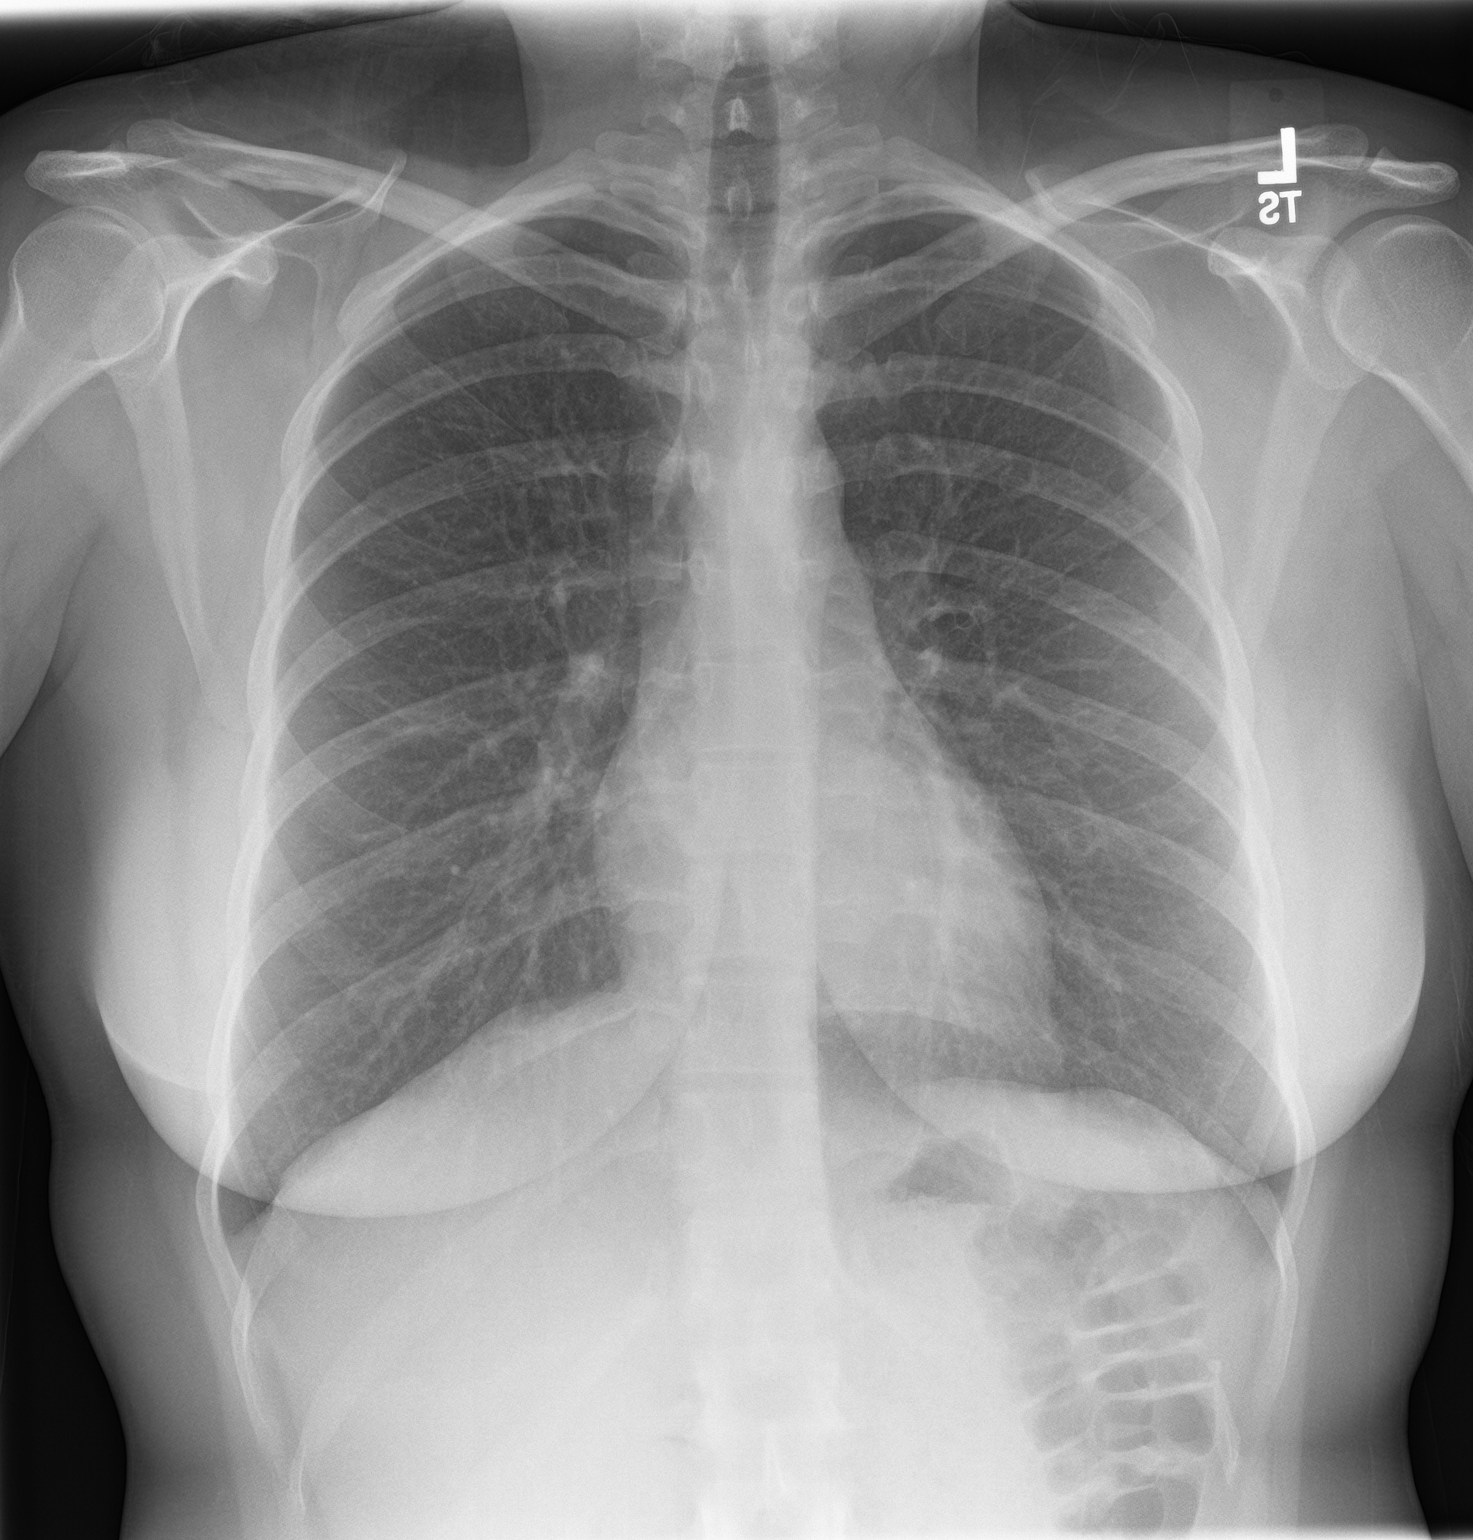
[im 2/2]
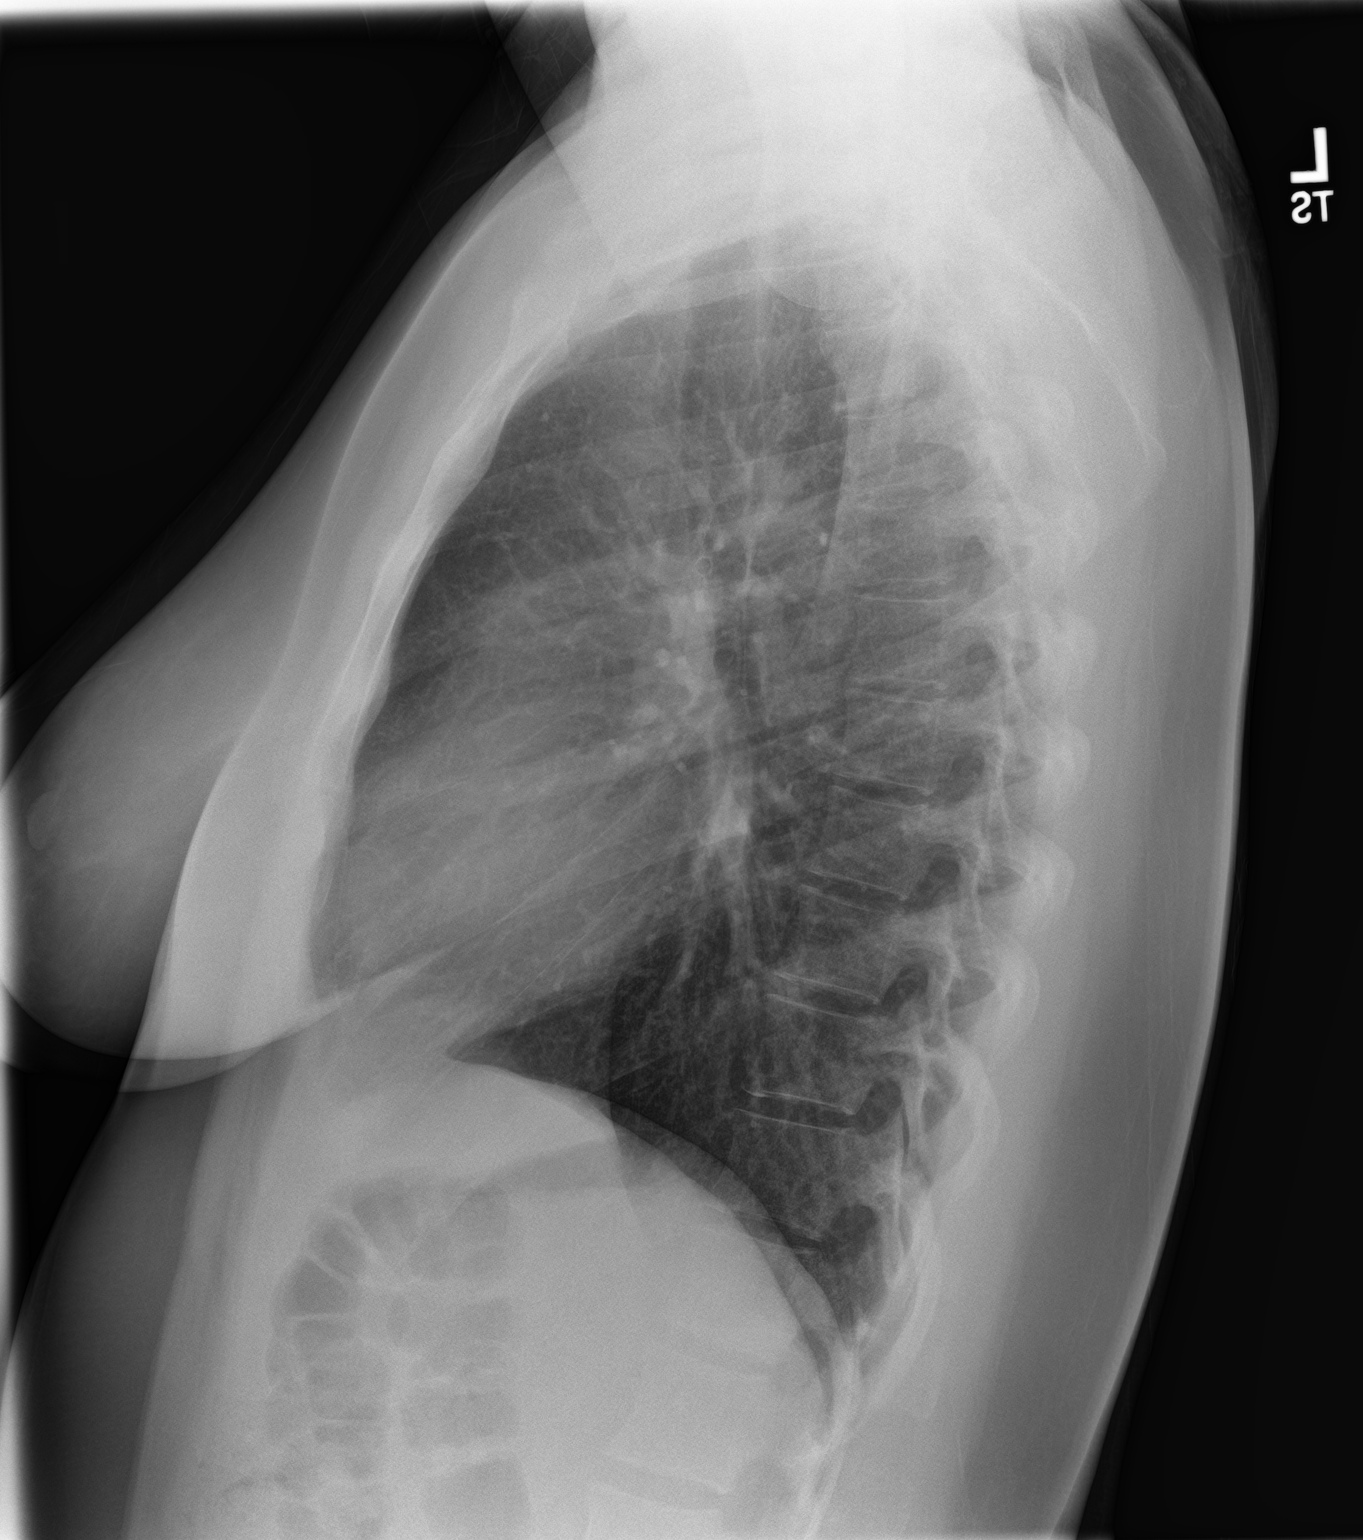

[2 of 2 positions shown; findings below may reference images not displayed]

FINDINGS: There is no edema or consolidation. The heart size and pulmonary
vascularity are normal. No adenopathy. No pneumothorax. No bone
lesions.
IMPRESSION: No edema or consolidation.

## 2019-06-18 DIAGNOSIS — F419 Anxiety disorder, unspecified: Secondary | ICD-10-CM | POA: Diagnosis not present

## 2019-06-23 DIAGNOSIS — H9212 Otorrhea, left ear: Secondary | ICD-10-CM | POA: Diagnosis not present

## 2019-06-23 DIAGNOSIS — B379 Candidiasis, unspecified: Secondary | ICD-10-CM | POA: Diagnosis not present

## 2019-06-23 DIAGNOSIS — H6690 Otitis media, unspecified, unspecified ear: Secondary | ICD-10-CM | POA: Diagnosis not present

## 2019-06-23 DIAGNOSIS — Z6830 Body mass index (BMI) 30.0-30.9, adult: Secondary | ICD-10-CM | POA: Diagnosis not present

## 2019-07-11 ENCOUNTER — Encounter: Payer: Self-pay | Admitting: Emergency Medicine

## 2019-07-11 ENCOUNTER — Emergency Department
Admission: EM | Admit: 2019-07-11 | Discharge: 2019-07-11 | Disposition: A | Discharge status: Home / Self Care | Payer: 59 | Attending: Emergency Medicine | Admitting: Emergency Medicine

## 2019-07-11 ENCOUNTER — Other Ambulatory Visit: Payer: Self-pay

## 2019-07-11 DIAGNOSIS — Z79899 Other long term (current) drug therapy: Secondary | ICD-10-CM | POA: Diagnosis not present

## 2019-07-11 DIAGNOSIS — M7918 Myalgia, other site: Secondary | ICD-10-CM | POA: Insufficient documentation

## 2019-07-11 DIAGNOSIS — R070 Pain in throat: Secondary | ICD-10-CM | POA: Diagnosis present

## 2019-07-11 DIAGNOSIS — Z91040 Latex allergy status: Secondary | ICD-10-CM | POA: Insufficient documentation

## 2019-07-11 DIAGNOSIS — R509 Fever, unspecified: Secondary | ICD-10-CM | POA: Insufficient documentation

## 2019-07-11 DIAGNOSIS — J029 Acute pharyngitis, unspecified: Secondary | ICD-10-CM | POA: Insufficient documentation

## 2019-07-11 DIAGNOSIS — Z20828 Contact with and (suspected) exposure to other viral communicable diseases: Secondary | ICD-10-CM | POA: Insufficient documentation

## 2019-07-11 DIAGNOSIS — B349 Viral infection, unspecified: Secondary | ICD-10-CM | POA: Insufficient documentation

## 2019-07-11 DIAGNOSIS — F17210 Nicotine dependence, cigarettes, uncomplicated: Secondary | ICD-10-CM | POA: Diagnosis not present

## 2019-07-11 LAB — GROUP A STREP BY PCR: Group A Strep by PCR: NOT DETECTED

## 2019-07-11 MED ORDER — ACETAMINOPHEN 500 MG PO TABS
ORAL_TABLET | ORAL | Status: AC
  Filled 2019-07-11: qty 2

## 2019-07-11 MED ORDER — PREDNISONE 20 MG PO TABS: ORAL_TABLET | ORAL | 0 refills | Status: AC

## 2019-07-11 MED ORDER — LIDOCAINE VISCOUS HCL 2 % MT SOLN
15.0000 mL | Freq: Once | OROMUCOSAL | Status: AC
  Administered 2019-07-11: 15 mL via OROMUCOSAL
  Filled 2019-07-11: qty 15

## 2019-07-11 MED ORDER — PREDNISONE 20 MG PO TABS
60.0000 mg | ORAL_TABLET | Freq: Once | ORAL | Status: AC
  Administered 2019-07-11: 60 mg via ORAL
  Filled 2019-07-11: qty 3

## 2019-07-11 MED ORDER — AZITHROMYCIN 500 MG PO TABS
500.0000 mg | ORAL_TABLET | Freq: Once | ORAL | Status: AC
  Administered 2019-07-11: 500 mg via ORAL
  Filled 2019-07-11: qty 1

## 2019-07-11 MED ORDER — ONDANSETRON 4 MG PO TBDP: 4.0000 mg | ORAL_TABLET | Freq: Three times a day (TID) | ORAL | 0 refills | Status: AC | PRN

## 2019-07-11 MED ORDER — ACETAMINOPHEN 500 MG PO TABS
1000.0000 mg | ORAL_TABLET | Freq: Once | ORAL | Status: AC
  Administered 2019-07-11: 1000 mg via ORAL

## 2019-07-11 MED ORDER — AZITHROMYCIN 250 MG PO TABS: 250.0000 mg | ORAL_TABLET | Freq: Every day | ORAL | 0 refills | Status: AC

## 2019-07-11 NOTE — ED Provider Notes (Signed)
[facility] Emergency Department Provider Note ____________________________________________  Time seen: 1839  I have reviewed the triage vital signs and the nursing notes.  HISTORY  Chief Complaint  Sore Throat and Generalized Body Aches   HPI Sarah Woods is a 33 y.o. female presents as up to the ED for evaluation of  sudden onset of sore throat, bodyaches, and fevers.  Patient describes a T-max yesterday of 2 F.  Denies any acute distress at this time.  She denies any difficulty controlling secretions, swallowing, or breathing.  She does report history of recurrent strep pharyngitis in the past.  Past Medical History:  Diagnosis Date  . Anxiety   . Bronchitis 06/2016  . Ear infection   . Family history of adverse reaction to anesthesia    SISTER-OXYGEN DROPPED DURING CS  . Headache    MIGRAINES  . Pneumonia   . PVC (premature ventricular contraction)     There are no problems to display for this patient.   Past Surgical History:  Procedure Laterality Date  . CESAREAN SECTION    . HEMORRHOID SURGERY N/A 11/23/2016   Procedure: HEMORRHOIDECTOMY;  Surgeon: Leonie Green, MD;  Location: [facility] ORS;  Service: General;  Laterality: N/A;  . SPHINCTEROTOMY N/A 11/23/2016   Procedure: Joan Mayans;  Surgeon: Leonie Green, MD;  Location: [facility] ORS;  Service: General;  Laterality: N/A;  . TONSILLECTOMY    . tubes in ears      Prior to Admission medications   Medication Sig Start Date End Date Taking? Authorizing Provider  azithromycin (ZITHROMAX Z-PAK) 250 MG tablet Take 1 tablet (250 mg total) by mouth daily for 4 days. 07/12/19 07/16/19  Pasquale Matters, Dannielle Karvonen, PA-C  cholecalciferol (VITAMIN D) 1000 units tablet Take 1,000 Units by mouth 2 (two) times daily.    [provider]  methylPREDNISolone (MEDROL DOSEPAK) 4 MG TBPK tablet Take 6 pills on day one then decrease by 1 pill each day 12/10/17   Versie Starks, PA-C   ondansetron (ZOFRAN ODT) 4 MG disintegrating tablet Take 1 tablet (4 mg total) by mouth every 8 (eight) hours as needed. 07/11/19   Kaneisha Ellenberger, Dannielle Karvonen, PA-C  predniSONE (DELTASONE) 20 MG tablet Take 3 tabs x 2 days, Take 2 tabs x 3, Take 1 tabs x 3 days, Take 0.5 tabs x 4 days. 07/12/19   Tamilyn Lupien, Dannielle Karvonen, PA-C  tiotropium (SPIRIVA) 18 MCG inhalation capsule Place 18 mcg into inhaler and inhale daily as needed (shortness of breath).     [provider]    Allergies Latex and Other  History reviewed. No pertinent family history.  Social History Social History   Tobacco Use  . Smoking status: Current Some Day Smoker    Packs/day: 1.00    Years: 13.00    Pack years: 13.00    Types: Cigarettes  . Smokeless tobacco: Never Used  Substance Use Topics  . Alcohol use: No  . Drug use: No    Review of Systems  Constitutional: Negative for fever. Eyes: Negative for visual changes. ENT: Negative for sore throat. Cardiovascular: Negative for chest pain. Respiratory: Negative for shortness of breath. Gastrointestinal: Negative for abdominal pain, vomiting and diarrhea. Genitourinary: Negative for dysuria. Musculoskeletal: Negative for back pain. Skin: Negative for rash. Neurological: Negative for headaches, focal weakness or numbness. ____________________________________________  PHYSICAL EXAM:  VITAL SIGNS: ED Triage Vitals  Enc Vitals Group     BP 07/11/19 1747 122/64     Pulse Rate  07/11/19 1747 (!) 112     Resp 07/11/19 1747 18     Temp 07/11/19 1747 98.7 F (37.1 C)     Temp Source 07/11/19 1747 Oral     SpO2 07/11/19 1747 97 %     Weight 07/11/19 1747 188 lb (85.3 kg)     Height 07/11/19 1747 5\' 6"  (1.676 m)     Head Circumference --      Peak Flow --      Pain Score 07/11/19 1751 6     Pain Loc --      Pain Edu? --      Excl. in [place]? --     Constitutional: Alert and oriented. Well appearing and in no distress. Head: Normocephalic and  atraumatic. Eyes: Conjunctivae are normal. PERRL. Normal extraocular movements Ears: Canals clear. TMs intact bilaterally. Nose: No congestion/rhinorrhea/epistaxis. Mouth/Throat: Mucous membranes are moist.  Uvula is midline but erythematous.  Oropharynx is erythematous well.  Tonsils are flat.  No brawny sublingual erythema is noted. Neck: Supple. No thyromegaly. Hematological/Lymphatic/Immunological: Palpable anterior cervical lymphadenopathy. Cardiovascular: Normal rate, regular rhythm. Normal distal pulses. Respiratory: Normal respiratory effort. No wheezes/rales/rhonchi. Gastrointestinal: Soft and nontender. No distention. ____________________________________________   LABS (pertinent positives/negatives) Labs Reviewed  GROUP A STREP BY PCR  SARS CORONAVIRUS 2 (TAT 6-24 HRS)  ____________________________________________  PROCEDURES  Viscous lido 2% gargle Tylenol 1000 mg PO Azithromycin 500 mg PO Prednisone 60 mg PO Procedures ____________________________________________  INITIAL IMPRESSION / ASSESSMENT AND PLAN / ED COURSE  Patient with ED evaluation of sudden onset of fevers and sore throat as well as body aches and malaise.  Patient clinical picture is concerning for possible bacterial etiology including strep pharyngitis.  She is also got symptoms concerning for possible viral respiratory etiology including influenza or coronavirus.  Patient will be treated empirically for suspected SARS coronavirus with initial ED dosing of prednisone and azithromycin.  Her rapid strep test is negative at this time, however, the azithromycin will treat any possible subclinical infection.  She will follow with primary provider or return to the ED as needed.  Covid test is pending at the time of discharge.  Sarah Woods was evaluated in Emergency Department on 07/11/2019 for the symptoms described in the history of present illness. She was evaluated in the context of the global  COVID-19 pandemic, which necessitated consideration that the patient might be at risk for infection with the SARS-CoV-2 virus that causes COVID-19. Institutional protocols and algorithms that pertain to the evaluation of patients at risk for COVID-19 are in a state of rapid change based on information released by regulatory bodies including the CDC and federal and state organizations. These policies and algorithms were followed during the patient's care in the ED. ____________________________________________  FINAL CLINICAL IMPRESSION(S) / ED DIAGNOSES  Final diagnoses:  Sore throat  Viral illness      Carmie End, Dannielle Karvonen, PA-C 07/11/19 2244    Arta Silence, MD 07/11/19 2254

## 2019-07-11 NOTE — Discharge Instructions (Addendum)
Your rapid strep test was reported as negative. Your COVID test is pending. Take the prescription meds as directed. Remain under house quarantine until your results are verified and your symptoms improve.

## 2019-07-11 NOTE — ED Triage Notes (Signed)
Pt in via POV, reports sore throat, body aches, fatigue x 2 days.  NAD noted at this time.

## 2019-07-12 LAB — SARS CORONAVIRUS 2 (TAT 6-24 HRS): SARS Coronavirus 2: NEGATIVE

## 2019-07-30 DIAGNOSIS — H6690 Otitis media, unspecified, unspecified ear: Secondary | ICD-10-CM | POA: Diagnosis not present

## 2019-10-15 DIAGNOSIS — H5203 Hypermetropia, bilateral: Secondary | ICD-10-CM | POA: Diagnosis not present

## 2019-10-15 DIAGNOSIS — H52223 Regular astigmatism, bilateral: Secondary | ICD-10-CM | POA: Diagnosis not present

## 2019-12-17 DIAGNOSIS — Z6832 Body mass index (BMI) 32.0-32.9, adult: Secondary | ICD-10-CM | POA: Diagnosis not present

## 2019-12-17 DIAGNOSIS — F419 Anxiety disorder, unspecified: Secondary | ICD-10-CM | POA: Diagnosis not present

## 2020-01-19 DIAGNOSIS — S39012A Strain of muscle, fascia and tendon of lower back, initial encounter: Secondary | ICD-10-CM | POA: Diagnosis not present

## 2020-02-09 ENCOUNTER — Other Ambulatory Visit (INDEPENDENT_AMBULATORY_CARE_PROVIDER_SITE_OTHER): Payer: 59 | Admitting: *Deleted

## 2020-02-09 DIAGNOSIS — D72829 Elevated white blood cell count, unspecified: Secondary | ICD-10-CM

## 2020-02-09 LAB — CBC WITH DIFFERENTIAL/PLATELET
Absolute Monocytes: 485 cells/uL (ref 200–950)
Basophils Absolute: 43 cells/uL (ref 0–200)
Basophils Relative: 0.5 %
Eosinophils Absolute: 196 cells/uL (ref 15–500)
Eosinophils Relative: 2.3 %
HCT: 39.1 % (ref 35.0–45.0)
Hemoglobin: 12.8 g/dL (ref 11.7–15.5)
Lymphs Abs: 2618 cells/uL (ref 850–3900)
MCH: 30.7 pg (ref 27.0–33.0)
MCHC: 32.7 g/dL (ref 32.0–36.0)
MCV: 93.8 fL (ref 80.0–100.0)
MPV: 10.3 fL (ref 7.5–12.5)
Monocytes Relative: 5.7 %
Neutro Abs: 5160 cells/uL (ref 1500–7800)
Neutrophils Relative %: 60.7 %
Platelets: 325 10*3/uL (ref 140–400)
RBC: 4.17 10*6/uL (ref 3.80–5.10)
RDW: 13.1 % (ref 11.0–15.0)
Total Lymphocyte: 30.8 %
WBC: 8.5 10*3/uL (ref 3.8–10.8)

## 2020-03-11 DIAGNOSIS — Z91040 Latex allergy status: Secondary | ICD-10-CM | POA: Diagnosis not present

## 2020-03-11 DIAGNOSIS — R05 Cough: Secondary | ICD-10-CM | POA: Diagnosis not present

## 2020-03-11 DIAGNOSIS — H9190 Unspecified hearing loss, unspecified ear: Secondary | ICD-10-CM | POA: Diagnosis not present

## 2020-03-11 DIAGNOSIS — Z03818 Encounter for observation for suspected exposure to other biological agents ruled out: Secondary | ICD-10-CM | POA: Diagnosis not present

## 2020-03-11 DIAGNOSIS — R5383 Other fatigue: Secondary | ICD-10-CM | POA: Diagnosis not present

## 2020-03-11 DIAGNOSIS — Z20822 Contact with and (suspected) exposure to covid-19: Secondary | ICD-10-CM | POA: Diagnosis not present

## 2020-03-11 DIAGNOSIS — R0602 Shortness of breath: Secondary | ICD-10-CM | POA: Diagnosis not present

## 2020-03-11 DIAGNOSIS — F17200 Nicotine dependence, unspecified, uncomplicated: Secondary | ICD-10-CM | POA: Diagnosis not present

## 2020-03-11 DIAGNOSIS — F419 Anxiety disorder, unspecified: Secondary | ICD-10-CM | POA: Diagnosis not present

## 2020-03-11 DIAGNOSIS — Z6832 Body mass index (BMI) 32.0-32.9, adult: Secondary | ICD-10-CM | POA: Diagnosis not present

## 2020-03-11 DIAGNOSIS — Z79899 Other long term (current) drug therapy: Secondary | ICD-10-CM | POA: Diagnosis not present

## 2020-03-11 DIAGNOSIS — J029 Acute pharyngitis, unspecified: Secondary | ICD-10-CM | POA: Diagnosis not present

## 2020-04-29 ENCOUNTER — Other Ambulatory Visit: Payer: Self-pay | Admitting: *Deleted

## 2020-04-29 MED ORDER — SULFAMETHOXAZOLE-TRIMETHOPRIM 800-160 MG PO TABS: 1.0000 | ORAL_TABLET | Freq: Two times a day (BID) | ORAL | 0 refills | Status: AC

## 2020-05-10 ENCOUNTER — Other Ambulatory Visit: Payer: Self-pay | Admitting: *Deleted

## 2020-05-10 MED ORDER — CLONAZEPAM 1 MG PO TABS
1.0000 mg | ORAL_TABLET | Freq: Two times a day (BID) | ORAL | 0 refills | Status: DC | PRN
Start: 2020-05-10 — End: 2020-09-29

## 2020-05-14 ENCOUNTER — Other Ambulatory Visit: Payer: Self-pay | Admitting: *Deleted

## 2020-05-14 DIAGNOSIS — L539 Erythematous condition, unspecified: Secondary | ICD-10-CM | POA: Diagnosis not present

## 2020-05-14 DIAGNOSIS — Z6832 Body mass index (BMI) 32.0-32.9, adult: Secondary | ICD-10-CM | POA: Diagnosis not present

## 2020-05-14 DIAGNOSIS — H9190 Unspecified hearing loss, unspecified ear: Secondary | ICD-10-CM | POA: Diagnosis not present

## 2020-05-14 DIAGNOSIS — F419 Anxiety disorder, unspecified: Secondary | ICD-10-CM | POA: Diagnosis not present

## 2020-05-14 DIAGNOSIS — L02415 Cutaneous abscess of right lower limb: Secondary | ICD-10-CM | POA: Diagnosis not present

## 2020-05-14 DIAGNOSIS — F17200 Nicotine dependence, unspecified, uncomplicated: Secondary | ICD-10-CM | POA: Diagnosis not present

## 2020-05-14 MED ORDER — FLUCONAZOLE 150 MG PO TABS: 150.0000 mg | ORAL_TABLET | Freq: Once | ORAL | 0 refills | Status: AC

## 2020-05-14 MED ORDER — CEPHALEXIN 500 MG PO CAPS: 500.0000 mg | ORAL_CAPSULE | Freq: Three times a day (TID) | ORAL | 0 refills | Status: DC

## 2020-05-14 MED ORDER — FLUCONAZOLE 150 MG PO TABS: 150.0000 mg | ORAL_TABLET | Freq: Once | ORAL | 0 refills | Status: DC

## 2020-05-14 MED ORDER — SULFAMETHOXAZOLE-TRIMETHOPRIM 800-160 MG PO TABS: 1.0000 | ORAL_TABLET | Freq: Two times a day (BID) | ORAL | 0 refills | Status: AC

## 2020-05-14 MED ORDER — CEPHALEXIN 500 MG PO CAPS: 500.0000 mg | ORAL_CAPSULE | Freq: Three times a day (TID) | ORAL | 0 refills | Status: AC

## 2020-05-14 NOTE — Addendum Note (Signed)
Addended by: Lacretia Nicks L on: 05/14/2020 10:06 AM   Modules accepted: Orders

## 2020-05-15 DIAGNOSIS — L02415 Cutaneous abscess of right lower limb: Secondary | ICD-10-CM | POA: Diagnosis not present

## 2020-05-15 DIAGNOSIS — F17200 Nicotine dependence, unspecified, uncomplicated: Secondary | ICD-10-CM | POA: Diagnosis not present

## 2020-05-15 DIAGNOSIS — H9190 Unspecified hearing loss, unspecified ear: Secondary | ICD-10-CM | POA: Diagnosis not present

## 2020-05-15 DIAGNOSIS — F419 Anxiety disorder, unspecified: Secondary | ICD-10-CM | POA: Diagnosis not present

## 2020-05-17 ENCOUNTER — Ambulatory Visit: Payer: Self-pay

## 2020-05-24 ENCOUNTER — Other Ambulatory Visit (INDEPENDENT_AMBULATORY_CARE_PROVIDER_SITE_OTHER): Payer: 59 | Admitting: *Deleted

## 2020-05-24 DIAGNOSIS — Z1152 Encounter for screening for COVID-19: Secondary | ICD-10-CM | POA: Diagnosis not present

## 2020-05-24 LAB — POC COVID19 BINAXNOW: SARS Coronavirus 2 Ag: NEGATIVE

## 2020-07-19 ENCOUNTER — Other Ambulatory Visit: Payer: Self-pay

## 2020-07-19 ENCOUNTER — Other Ambulatory Visit: Payer: 59

## 2020-07-19 DIAGNOSIS — Z20822 Contact with and (suspected) exposure to covid-19: Secondary | ICD-10-CM | POA: Diagnosis not present

## 2020-07-21 LAB — SARS-COV-2, NAA 2 DAY TAT

## 2020-07-21 LAB — NOVEL CORONAVIRUS, NAA: SARS-CoV-2, NAA: NOT DETECTED

## 2020-07-30 ENCOUNTER — Other Ambulatory Visit: Payer: 59

## 2020-07-30 DIAGNOSIS — Z20822 Contact with and (suspected) exposure to covid-19: Secondary | ICD-10-CM | POA: Diagnosis not present

## 2020-08-01 LAB — SARS-COV-2, NAA 2 DAY TAT

## 2020-08-01 LAB — NOVEL CORONAVIRUS, NAA: SARS-CoV-2, NAA: DETECTED — AB

## 2020-09-29 ENCOUNTER — Other Ambulatory Visit: Payer: Self-pay | Admitting: Family Medicine

## 2020-09-29 MED ORDER — CLONAZEPAM 1 MG PO TABS: 1.0000 mg | ORAL_TABLET | Freq: Two times a day (BID) | ORAL | 0 refills | Status: DC | PRN

## 2020-10-18 ENCOUNTER — Other Ambulatory Visit: Payer: Self-pay | Admitting: *Deleted

## 2020-10-18 MED ORDER — BUPROPION HCL ER (SR) 150 MG PO TB12: 150.0000 mg | ORAL_TABLET | Freq: Two times a day (BID) | ORAL | 1 refills | Status: DC

## 2020-10-19 ENCOUNTER — Other Ambulatory Visit: Payer: Self-pay | Admitting: Internal Medicine

## 2020-10-19 ENCOUNTER — Other Ambulatory Visit: Payer: Self-pay

## 2020-10-19 MED ORDER — BUPROPION HCL ER (SR) 150 MG PO TB12
150.0000 mg | ORAL_TABLET | Freq: Two times a day (BID) | ORAL | 2 refills | Status: AC
  Filled 2020-10-19: qty 180, 90d supply, fill #0

## 2020-10-28 DIAGNOSIS — L700 Acne vulgaris: Secondary | ICD-10-CM | POA: Diagnosis not present

## 2020-10-28 DIAGNOSIS — D2262 Melanocytic nevi of left upper limb, including shoulder: Secondary | ICD-10-CM | POA: Diagnosis not present

## 2020-10-28 DIAGNOSIS — D485 Neoplasm of uncertain behavior of skin: Secondary | ICD-10-CM | POA: Diagnosis not present

## 2020-10-28 DIAGNOSIS — L732 Hidradenitis suppurativa: Secondary | ICD-10-CM | POA: Diagnosis not present

## 2020-12-15 ENCOUNTER — Other Ambulatory Visit: Payer: Self-pay | Admitting: *Deleted

## 2020-12-15 MED ORDER — CIPROFLOXACIN-DEXAMETHASONE 0.3-0.1 % OT SUSP: 4.0000 [drp] | Freq: Two times a day (BID) | OTIC | 1 refills | Status: AC

## 2020-12-29 DIAGNOSIS — Z79899 Other long term (current) drug therapy: Secondary | ICD-10-CM | POA: Diagnosis not present

## 2020-12-29 DIAGNOSIS — R5383 Other fatigue: Secondary | ICD-10-CM | POA: Diagnosis not present

## 2020-12-29 DIAGNOSIS — Z0000 Encounter for general adult medical examination without abnormal findings: Secondary | ICD-10-CM | POA: Diagnosis not present

## 2021-03-29 DIAGNOSIS — Z114 Encounter for screening for human immunodeficiency virus [HIV]: Secondary | ICD-10-CM | POA: Diagnosis not present

## 2021-03-29 DIAGNOSIS — Z113 Encounter for screening for infections with a predominantly sexual mode of transmission: Secondary | ICD-10-CM | POA: Diagnosis not present

## 2021-03-30 ENCOUNTER — Other Ambulatory Visit: Payer: Self-pay | Admitting: Internal Medicine

## 2021-03-30 MED ORDER — CLONAZEPAM 1 MG PO TABS: 1.0000 mg | ORAL_TABLET | Freq: Two times a day (BID) | ORAL | 1 refills | Status: DC | PRN

## 2021-04-20 ENCOUNTER — Other Ambulatory Visit: Payer: Self-pay | Admitting: *Deleted

## 2021-05-07 DIAGNOSIS — Z20822 Contact with and (suspected) exposure to covid-19: Secondary | ICD-10-CM | POA: Diagnosis not present

## 2021-05-07 DIAGNOSIS — B348 Other viral infections of unspecified site: Secondary | ICD-10-CM | POA: Diagnosis not present

## 2021-05-09 ENCOUNTER — Other Ambulatory Visit: Payer: Self-pay | Admitting: *Deleted

## 2021-05-25 DIAGNOSIS — L02219 Cutaneous abscess of trunk, unspecified: Secondary | ICD-10-CM | POA: Diagnosis not present

## 2021-05-25 DIAGNOSIS — L02212 Cutaneous abscess of back [any part, except buttock]: Secondary | ICD-10-CM | POA: Diagnosis not present

## 2021-05-26 DIAGNOSIS — L02212 Cutaneous abscess of back [any part, except buttock]: Secondary | ICD-10-CM | POA: Diagnosis not present

## 2021-05-27 DIAGNOSIS — Z09 Encounter for follow-up examination after completed treatment for conditions other than malignant neoplasm: Secondary | ICD-10-CM | POA: Diagnosis not present

## 2021-09-28 ENCOUNTER — Other Ambulatory Visit: Payer: Self-pay | Admitting: *Deleted

## 2021-09-28 MED ORDER — CLONAZEPAM 1 MG PO TABS: 1.0000 mg | ORAL_TABLET | Freq: Two times a day (BID) | ORAL | 1 refills | Status: AC | PRN

## 2021-10-13 ENCOUNTER — Other Ambulatory Visit: Payer: Self-pay | Admitting: *Deleted

## 2021-10-13 DIAGNOSIS — N6310 Unspecified lump in the right breast, unspecified quadrant: Secondary | ICD-10-CM

## 2021-10-17 ENCOUNTER — Other Ambulatory Visit: Payer: Self-pay | Admitting: Internal Medicine

## 2021-10-17 DIAGNOSIS — N6310 Unspecified lump in the right breast, unspecified quadrant: Secondary | ICD-10-CM

## 2021-11-04 ENCOUNTER — Ambulatory Visit
Admission: RE | Admit: 2021-11-04 | Discharge: 2021-11-04 | Disposition: A | Discharge status: Home / Self Care | Payer: 59 | Source: Ambulatory Visit | Attending: Internal Medicine | Admitting: Internal Medicine

## 2021-11-04 DIAGNOSIS — N6310 Unspecified lump in the right breast, unspecified quadrant: Secondary | ICD-10-CM | POA: Diagnosis not present

## 2021-11-04 DIAGNOSIS — N6489 Other specified disorders of breast: Secondary | ICD-10-CM | POA: Diagnosis not present

## 2021-11-04 DIAGNOSIS — R928 Other abnormal and inconclusive findings on diagnostic imaging of breast: Secondary | ICD-10-CM | POA: Diagnosis not present

## 2021-11-07 ENCOUNTER — Other Ambulatory Visit: Payer: Self-pay | Admitting: *Deleted

## 2021-11-07 MED ORDER — PHENTERMINE HCL 15 MG PO CAPS: 15.0000 mg | ORAL_CAPSULE | ORAL | 3 refills | Status: DC

## 2021-12-09 ENCOUNTER — Other Ambulatory Visit: Payer: Self-pay

## 2021-12-19 ENCOUNTER — Other Ambulatory Visit: Payer: Self-pay

## 2021-12-19 MED ORDER — NICOTINE 21 MG/24HR TD PT24
MEDICATED_PATCH | TRANSDERMAL | 0 refills | Status: AC
  Filled 2021-12-19: qty 14, 14d supply, fill #0

## 2021-12-20 ENCOUNTER — Other Ambulatory Visit: Payer: Self-pay

## 2021-12-24 DIAGNOSIS — L0390 Cellulitis, unspecified: Secondary | ICD-10-CM | POA: Diagnosis not present

## 2021-12-24 DIAGNOSIS — H6012 Cellulitis of left external ear: Secondary | ICD-10-CM | POA: Diagnosis not present

## 2021-12-26 ENCOUNTER — Other Ambulatory Visit: Payer: Self-pay | Admitting: *Deleted

## 2021-12-26 ENCOUNTER — Other Ambulatory Visit: Payer: Self-pay

## 2021-12-26 DIAGNOSIS — H60392 Other infective otitis externa, left ear: Secondary | ICD-10-CM | POA: Diagnosis not present

## 2021-12-26 DIAGNOSIS — H6092 Unspecified otitis externa, left ear: Secondary | ICD-10-CM | POA: Diagnosis not present

## 2021-12-26 MED ORDER — HYDROCODONE-ACETAMINOPHEN 7.5-300 MG PO TABS: ORAL_TABLET | ORAL | 0 refills | Status: AC

## 2021-12-26 MED ORDER — GENTAMICIN SULFATE 0.3 % OP SOLN
OPHTHALMIC | 2 refills | Status: AC
  Filled 2021-12-26: qty 5, 10d supply, fill #0

## 2021-12-26 MED ORDER — PREDNISONE 10 MG PO TABS
ORAL_TABLET | ORAL | 0 refills | Status: AC
  Filled 2021-12-26: qty 21, 6d supply, fill #0

## 2022-01-08 ENCOUNTER — Other Ambulatory Visit: Payer: Self-pay

## 2022-01-09 DIAGNOSIS — H60399 Other infective otitis externa, unspecified ear: Secondary | ICD-10-CM | POA: Diagnosis not present

## 2022-01-31 ENCOUNTER — Other Ambulatory Visit: Payer: Self-pay | Admitting: *Deleted

## 2022-01-31 MED ORDER — PHENTERMINE HCL 15 MG PO CAPS: 15.0000 mg | ORAL_CAPSULE | ORAL | 3 refills | Status: AC

## 2022-02-28 ENCOUNTER — Ambulatory Visit: Payer: 59 | Admitting: *Deleted

## 2022-02-28 DIAGNOSIS — Z0000 Encounter for general adult medical examination without abnormal findings: Secondary | ICD-10-CM

## 2022-03-01 LAB — CBC WITH DIFFERENTIAL/PLATELET
Absolute Monocytes: 449 cells/uL (ref 200–950)
Basophils Absolute: 41 cells/uL (ref 0–200)
Basophils Relative: 0.6 %
Eosinophils Absolute: 150 cells/uL (ref 15–500)
Eosinophils Relative: 2.2 %
HCT: 39.6 % (ref 35.0–45.0)
Hemoglobin: 13.5 g/dL (ref 11.7–15.5)
Lymphs Abs: 1945 cells/uL (ref 850–3900)
MCH: 31.8 pg (ref 27.0–33.0)
MCHC: 34.1 g/dL (ref 32.0–36.0)
MCV: 93.2 fL (ref 80.0–100.0)
MPV: 9.9 fL (ref 7.5–12.5)
Monocytes Relative: 6.6 %
Neutro Abs: 4216 cells/uL (ref 1500–7800)
Neutrophils Relative %: 62 %
Platelets: 360 10*3/uL (ref 140–400)
RBC: 4.25 10*6/uL (ref 3.80–5.10)
RDW: 12.7 % (ref 11.0–15.0)
Total Lymphocyte: 28.6 %
WBC: 6.8 10*3/uL (ref 3.8–10.8)

## 2022-03-01 LAB — LIPID PANEL
Cholesterol: 192 mg/dL (ref <200)
HDL: 49 mg/dL — ABNORMAL LOW (ref >=50)
LDL Cholesterol (Calc): 117 mg/dL (calc) — ABNORMAL HIGH
Non-HDL Cholesterol (Calc): 143 mg/dL (calc) — ABNORMAL HIGH (ref <130)
Total CHOL/HDL Ratio: 3.9 (calc) (ref <5.0)
Triglycerides: 150 mg/dL — ABNORMAL HIGH (ref <150)

## 2022-03-01 LAB — COMPLETE METABOLIC PANEL WITH GFR
AG Ratio: 1.4 (calc) (ref 1.0–2.5)
ALT: 16 U/L (ref 6–29)
AST: 22 U/L (ref 10–30)
Albumin: 4.6 g/dL (ref 3.6–5.1)
Alkaline phosphatase (APISO): 65 U/L (ref 31–125)
BUN: 9 mg/dL (ref 7–25)
CO2: 22 mmol/L (ref 20–32)
Calcium: 9.6 mg/dL (ref 8.6–10.2)
Chloride: 102 mmol/L (ref 98–110)
Creat: 0.89 mg/dL (ref 0.50–0.97)
Globulin: 3.3 g/dL (calc) (ref 1.9–3.7)
Glucose, Bld: 82 mg/dL (ref 65–99)
Potassium: 4.3 mmol/L (ref 3.5–5.3)
Sodium: 137 mmol/L (ref 135–146)
Total Bilirubin: 0.8 mg/dL (ref 0.2–1.2)
Total Protein: 7.9 g/dL (ref 6.1–8.1)
eGFR: 87 mL/min/{1.73_m2} (ref >=60)

## 2022-03-01 LAB — TSH: TSH: 1.57 mIU/L

## 2022-03-13 DIAGNOSIS — Z7689 Persons encountering health services in other specified circumstances: Secondary | ICD-10-CM | POA: Diagnosis not present

## 2022-04-17 ENCOUNTER — Other Ambulatory Visit: Payer: Self-pay | Admitting: *Deleted

## 2022-04-17 MED ORDER — PHENTERMINE HCL 15 MG PO CAPS: 15.0000 mg | ORAL_CAPSULE | ORAL | 2 refills | Status: AC

## 2022-09-25 ENCOUNTER — Other Ambulatory Visit (HOSPITAL_BASED_OUTPATIENT_CLINIC_OR_DEPARTMENT_OTHER): Payer: Self-pay

## 2024-01-12 IMAGING — US US BREAST*R* LIMITED INC AXILLA
1 series · 2 of 2 positions shown · non-contrast
Comparison: None.

CLINICAL DATA: Patient reports a palpable abnormality in the upper
outer right breast.

EXAM:
DIGITAL DIAGNOSTIC BILATERAL MAMMOGRAM WITH TOMOSYNTHESIS AND CAD;
ULTRASOUND RIGHT BREAST LIMITED
TECHNIQUE: Bilateral digital diagnostic mammography and breast tomosynthesis
was performed. The images were evaluated with computer-aided
detection.; Targeted ultrasound examination of the right breast was
performed

[Series 1: us breast*right* limited inc axilla · 0.06mm/px · 2 of 2 slices shown]
[im 1/2]
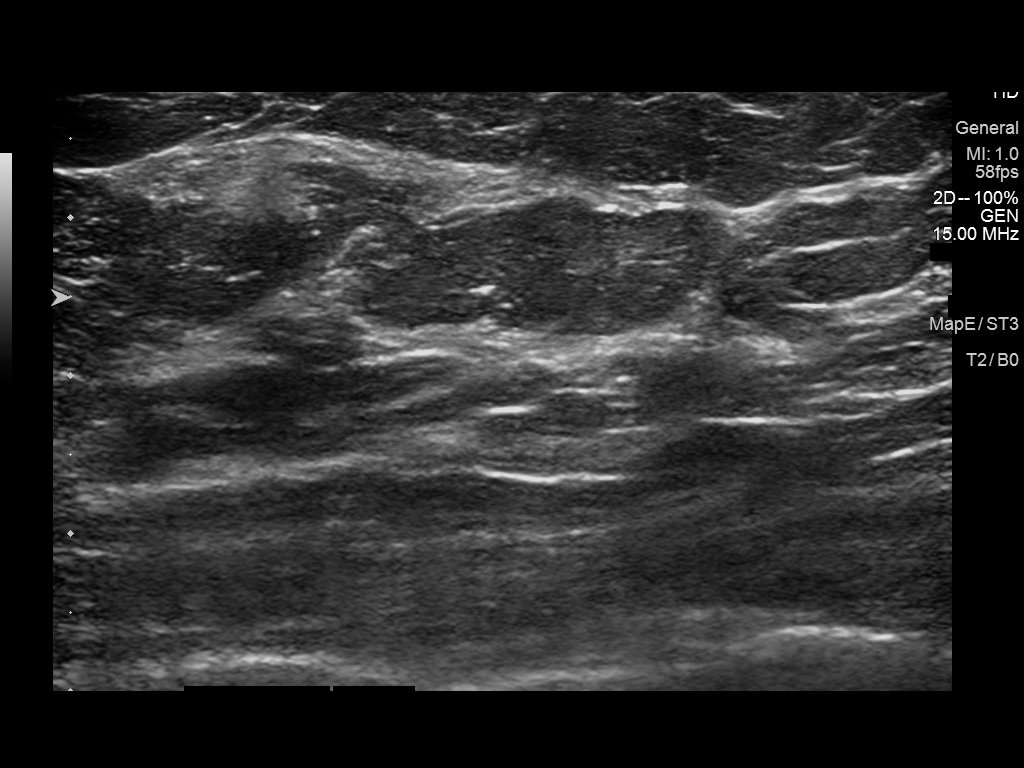
[im 2/2]
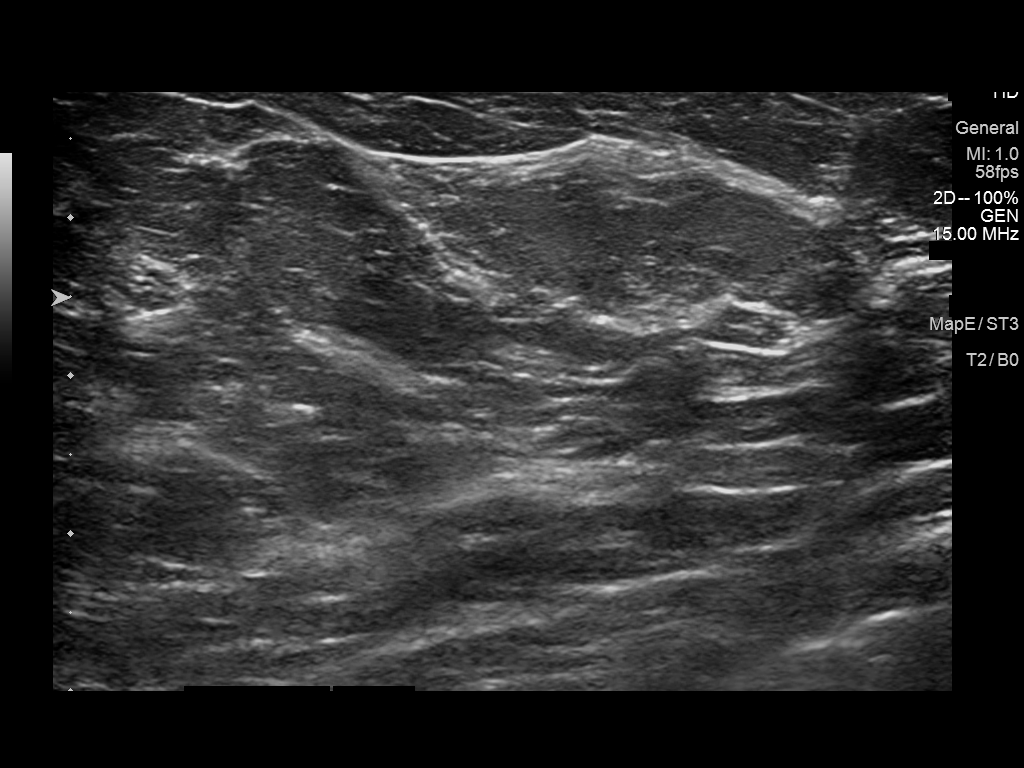

[2 of 2 positions shown; findings below may reference images not displayed]

ACR Breast Density Category b: There are scattered areas of
fibroglandular density.
FINDINGS: There are no masses, areas of architectural distortion, areas of
significant asymmetry or suspicious calcifications.

On physical exam, no discrete mass is palpated in the upper outer
right breast.

Targeted ultrasound is performed, showing normal tissue throughout
the upper outer right breast. No mass.
IMPRESSION: No evidence of breast malignancy.

RECOMMENDATION:
Screening mammogram at age 42 unless there are persistent or
intervening clinical concerns. (Code:TT-S-EXP)

I have discussed the findings and recommendations with the patient.
If applicable, a reminder letter will be sent to the patient
regarding the next appointment.

BI-RADS CATEGORY  1: Negative.
